# Patient Record
Sex: Female | Born: 1940 | Race: White | Hispanic: No | Smoking: Never smoker
Health system: Southern US, Community
[De-identification: ages and names within clinical notes are randomized; demographics above are authoritative.]

## PROBLEM LIST (undated history)

## (undated) DIAGNOSIS — G629 Polyneuropathy, unspecified: Secondary | ICD-10-CM

## (undated) DIAGNOSIS — F32A Depression, unspecified: Secondary | ICD-10-CM

## (undated) DIAGNOSIS — I10 Essential (primary) hypertension: Secondary | ICD-10-CM

## (undated) DIAGNOSIS — F329 Major depressive disorder, single episode, unspecified: Secondary | ICD-10-CM

## (undated) DIAGNOSIS — M519 Unspecified thoracic, thoracolumbar and lumbosacral intervertebral disc disorder: Secondary | ICD-10-CM

## (undated) DIAGNOSIS — I639 Cerebral infarction, unspecified: Secondary | ICD-10-CM

## (undated) HISTORY — DX: Polyneuropathy, unspecified: G62.9

## (undated) HISTORY — PX: CHOLECYSTECTOMY: SHX55

## (undated) HISTORY — DX: Essential (primary) hypertension: I10

## (undated) HISTORY — PX: BILATERAL SALPINGOOPHORECTOMY: SHX1223

## (undated) HISTORY — DX: Major depressive disorder, single episode, unspecified: F32.9

## (undated) HISTORY — DX: Cerebral infarction, unspecified: I63.9

## (undated) HISTORY — DX: Depression, unspecified: F32.A

## (undated) HISTORY — DX: Unspecified thoracic, thoracolumbar and lumbosacral intervertebral disc disorder: M51.9

## (undated) HISTORY — PX: TOTAL ABDOMINAL HYSTERECTOMY: SHX209

---

## 1991-10-03 HISTORY — PX: OTHER SURGICAL HISTORY: SHX169

## 2006-10-02 HISTORY — PX: PARTIAL COLECTOMY: SHX5273

## 2009-10-08 ENCOUNTER — Ambulatory Visit: Payer: Self-pay | Admitting: Family Medicine

## 2009-10-08 DIAGNOSIS — M545 Low back pain, unspecified: Secondary | ICD-10-CM | POA: Insufficient documentation

## 2009-10-08 DIAGNOSIS — I10 Essential (primary) hypertension: Secondary | ICD-10-CM | POA: Insufficient documentation

## 2009-10-08 DIAGNOSIS — F411 Generalized anxiety disorder: Secondary | ICD-10-CM | POA: Insufficient documentation

## 2009-10-08 DIAGNOSIS — F329 Major depressive disorder, single episode, unspecified: Secondary | ICD-10-CM | POA: Insufficient documentation

## 2009-10-08 DIAGNOSIS — F3289 Other specified depressive episodes: Secondary | ICD-10-CM | POA: Insufficient documentation

## 2009-11-17 ENCOUNTER — Telehealth: Payer: Self-pay | Admitting: Family Medicine

## 2009-12-15 ENCOUNTER — Telehealth: Payer: Self-pay | Admitting: Family Medicine

## 2009-12-31 ENCOUNTER — Ambulatory Visit: Payer: Self-pay | Admitting: Family Medicine

## 2010-02-08 ENCOUNTER — Telehealth: Payer: Self-pay | Admitting: Family Medicine

## 2010-04-05 ENCOUNTER — Ambulatory Visit: Payer: Self-pay | Admitting: Family Medicine

## 2010-04-05 DIAGNOSIS — K121 Other forms of stomatitis: Secondary | ICD-10-CM | POA: Insufficient documentation

## 2010-04-05 DIAGNOSIS — K1230 Oral mucositis (ulcerative), unspecified: Secondary | ICD-10-CM

## 2010-04-05 DIAGNOSIS — J029 Acute pharyngitis, unspecified: Secondary | ICD-10-CM

## 2010-04-05 DIAGNOSIS — R05 Cough: Secondary | ICD-10-CM

## 2010-05-02 ENCOUNTER — Ambulatory Visit: Payer: Self-pay | Admitting: Family Medicine

## 2010-05-02 DIAGNOSIS — G589 Mononeuropathy, unspecified: Secondary | ICD-10-CM | POA: Insufficient documentation

## 2010-05-04 LAB — CONVERTED CEMR LAB
ALT: 62 U/L — ABNORMAL HIGH (ref 0–35)
AST: 43 U/L — ABNORMAL HIGH (ref 0–37)
Albumin: 3.5 g/dL (ref 3.5–5.2)
Alkaline Phosphatase: 84 U/L (ref 39–117)
BUN: 13 mg/dL (ref 6–23)
Basophils Absolute: 0 K/uL (ref 0.0–0.1)
Basophils Relative: 0.5 % (ref 0.0–3.0)
Bilirubin, Direct: 0.2 mg/dL (ref 0.0–0.3)
CO2: 31 meq/L (ref 19–32)
Calcium: 9.6 mg/dL (ref 8.4–10.5)
Chloride: 106 meq/L (ref 96–112)
Creatinine, Ser: 0.8 mg/dL (ref 0.4–1.2)
Eosinophils Absolute: 0.2 K/uL (ref 0.0–0.7)
Eosinophils Relative: 2.7 % (ref 0.0–5.0)
GFR calc non Af Amer: 77.47 mL/min (ref >60)
Glucose, Bld: 122 mg/dL — ABNORMAL HIGH (ref 70–99)
HCT: 37.9 % (ref 36.0–46.0)
Hemoglobin: 13.3 g/dL (ref 12.0–15.0)
Hgb A1c MFr Bld: 5.5 % (ref 4.6–6.5)
Lymphocytes Relative: 40.5 % (ref 12.0–46.0)
Lymphs Abs: 3.2 K/uL (ref 0.7–4.0)
MCHC: 35 g/dL (ref 30.0–36.0)
MCV: 92.6 fL (ref 78.0–100.0)
Monocytes Absolute: 0.4 K/uL (ref 0.1–1.0)
Monocytes Relative: 5.7 % (ref 3.0–12.0)
Neutro Abs: 4 K/uL (ref 1.4–7.7)
Neutrophils Relative %: 50.6 % (ref 43.0–77.0)
Platelets: 146 K/uL — ABNORMAL LOW (ref 150.0–400.0)
Potassium: 4 meq/L (ref 3.5–5.1)
RBC: 4.09 M/uL (ref 3.87–5.11)
RDW: 14.5 % (ref 11.5–14.6)
Sodium: 140 meq/L (ref 135–145)
TSH: 3.27 u[IU]/mL (ref 0.35–5.50)
Total Bilirubin: 0.7 mg/dL (ref 0.3–1.2)
Total Protein: 5.9 g/dL — ABNORMAL LOW (ref 6.0–8.3)
Vitamin B-12: 494 pg/mL (ref 211–911)
WBC: 7.9 10*3/microliter (ref 4.5–10.5)

## 2010-05-05 ENCOUNTER — Ambulatory Visit: Payer: Self-pay | Admitting: Family Medicine

## 2010-05-05 ENCOUNTER — Telehealth: Payer: Self-pay | Admitting: Family Medicine

## 2010-05-05 DIAGNOSIS — K119 Disease of salivary gland, unspecified: Secondary | ICD-10-CM | POA: Insufficient documentation

## 2010-05-13 ENCOUNTER — Ambulatory Visit: Payer: Self-pay | Admitting: Family Medicine

## 2010-05-13 DIAGNOSIS — Z8679 Personal history of other diseases of the circulatory system: Secondary | ICD-10-CM | POA: Insufficient documentation

## 2010-05-25 ENCOUNTER — Telehealth: Payer: Self-pay | Admitting: Family Medicine

## 2010-05-30 ENCOUNTER — Ambulatory Visit: Payer: Self-pay | Admitting: Family Medicine

## 2010-05-30 DIAGNOSIS — J019 Acute sinusitis, unspecified: Secondary | ICD-10-CM

## 2010-06-07 ENCOUNTER — Ambulatory Visit: Payer: Self-pay | Admitting: Family Medicine

## 2010-06-07 DIAGNOSIS — S93409A Sprain of unspecified ligament of unspecified ankle, initial encounter: Secondary | ICD-10-CM | POA: Insufficient documentation

## 2010-07-01 ENCOUNTER — Telehealth: Payer: Self-pay | Admitting: Family Medicine

## 2010-07-06 ENCOUNTER — Ambulatory Visit: Payer: Self-pay | Admitting: Family Medicine

## 2010-07-06 DIAGNOSIS — J069 Acute upper respiratory infection, unspecified: Secondary | ICD-10-CM | POA: Insufficient documentation

## 2010-07-22 ENCOUNTER — Telehealth: Payer: Self-pay | Admitting: Family Medicine

## 2010-08-15 ENCOUNTER — Telehealth: Payer: Self-pay | Admitting: Family Medicine

## 2010-08-30 ENCOUNTER — Telehealth: Payer: Self-pay | Admitting: Family Medicine

## 2010-09-01 ENCOUNTER — Telehealth: Payer: Self-pay | Admitting: Family Medicine

## 2010-09-01 ENCOUNTER — Inpatient Hospital Stay (HOSPITAL_COMMUNITY)
Admission: EM | Admit: 2010-09-01 | Discharge: 2010-09-08 | Payer: Self-pay | Source: Home / Self Care | Attending: Internal Medicine | Admitting: Internal Medicine

## 2010-09-12 ENCOUNTER — Emergency Department (HOSPITAL_COMMUNITY)
Admission: EM | Admit: 2010-09-12 | Discharge: 2010-09-12 | Payer: Self-pay | Source: Home / Self Care | Admitting: Emergency Medicine

## 2010-09-14 ENCOUNTER — Telehealth: Payer: Self-pay | Admitting: Family Medicine

## 2010-09-15 ENCOUNTER — Telehealth: Payer: Self-pay | Admitting: Family Medicine

## 2010-09-27 ENCOUNTER — Telehealth: Payer: Self-pay | Admitting: Family Medicine

## 2010-10-06 ENCOUNTER — Telehealth: Payer: Self-pay | Admitting: Family Medicine

## 2010-10-11 ENCOUNTER — Ambulatory Visit: Admit: 2010-10-11 | Payer: Self-pay | Admitting: Family Medicine

## 2010-10-17 ENCOUNTER — Telehealth: Payer: Self-pay | Admitting: Family Medicine

## 2010-10-18 ENCOUNTER — Encounter: Payer: Self-pay | Admitting: Family Medicine

## 2010-10-25 ENCOUNTER — Telehealth: Payer: Self-pay | Admitting: Family Medicine

## 2010-10-25 ENCOUNTER — Encounter: Payer: Self-pay | Admitting: Family Medicine

## 2010-10-28 ENCOUNTER — Encounter: Payer: Self-pay | Admitting: Family Medicine

## 2010-10-31 ENCOUNTER — Encounter: Payer: Self-pay | Admitting: Family Medicine

## 2010-11-01 NOTE — Assessment & Plan Note (Signed)
Summary: F/U ON MEDS, FEVER / CHILLS // RS   Vital Signs:  Patient profile:   70 year old female Temp:     98.1 degrees F oral BP sitting:   92 / 68  (left arm) Cuff size:   regular  Vitals Entered By: Raechel Ache, RN (May 30, 2010 2:33 PM) CC: C/o sore throat, sores in mouth, toes tingling and hurt- quit Neurontin.   History of Present Illness: here with her daughter to follow up neuropathy pains in her feet and for a possibel sinus infection. We tried neurontin at 300 mg two times a day and also at 100 mg two times a day , but she cannot tolerate this due to sedation effects. Also she has had 2 weeks of sinus pressure, PND, ST, and a dry cough. No fever. She took some Amoxicillin a few weeks ago that did not do much for her.   Allergies: 1)  ! Sulfa  Past History:  Past Medical History: Reviewed history from 05/13/2010 and no changes required. 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 3 level lumbar disc fusion surgery in 1993 Cholecystectomy TAH and BSO partial colectomy in 2008 (removed 4 feet of bowel) for what sounds like mesenteric ischemia  Review of Systems  The patient denies anorexia, fever, weight loss, weight gain, vision loss, decreased hearing, hoarseness, chest pain, syncope, dyspnea on exertion, peripheral edema, hemoptysis, abdominal pain, melena, hematochezia, severe indigestion/heartburn, hematuria, incontinence, genital sores, muscle weakness, suspicious skin lesions, transient blindness, difficulty walking, depression, unusual weight change, abnormal bleeding, enlarged lymph nodes, angioedema, breast masses, and testicular masses.    Physical Exam  General:  Well-developed,well-nourished,in no acute distress;  alert,appropriate and cooperative throughout examination Head:  Normocephalic and atraumatic without obvious abnormalities. No apparent alopecia or balding. Eyes:  No corneal or conjunctival inflammation noted. EOMI. Perrla. Funduscopic exam benign, without hemorrhages, exudates or papilledema. Vision grossly normal. Ears:  External ear exam shows no significant lesions or deformities.  Otoscopic examination reveals clear canals, tympanic membranes are intact bilaterally without bulging, retraction, inflammation or discharge. Hearing is grossly normal bilaterally. Nose:  External nasal examination shows no deformity or inflammation. Nasal mucosa are pink and moist without lesions or exudates. Mouth:  Oral mucosa and oropharynx without lesions or exudates.  Teeth in good repair. Neck:  No deformities, masses, or tenderness noted. Lungs:  Normal respiratory effort, chest expands symmetrically. Lungs are clear to auscultation, no crackles or wheezes.   Impression & Recommendations:  Problem # 1:  ACUTE SINUSITIS, UNSPECIFIED (ICD-461.9)  The following medications were removed from the medication list:    Amoxicillin 500 Mg Caps (Amoxicillin) .Marland Kitchen... 1 stat then 1  morn midatternoon Her updated medication list for this problem includes:    Hydromet 5-1.5 Mg/31ml Syrp (Hydrocodone-homatropine) .Marland Kitchen... 2 tsp q4h as needed for cough    Zithromax Z-pak 250 Mg Tabs (Azithromycin) .Marland Kitchen... As directed  Problem # 2:  NEUROPATHY (ICD-355.9)  Complete Medication List: 1)  Clorazepate Dipotassium 7.5 Mg Tabs (Clorazepate dipotassium) .... 4 times daily 2)  Clonazepam 0.5 Mg Tabs (Clonazepam) .Marland Kitchen.. 1 four  times a day 3)  Oxycodone-acetaminophen 10-650 Mg Tabs (Oxycodone-acetaminophen) .... 4 times daily 4)  Diovan Hct 80-12.5 Mg Tabs (Valsartan-hydrochlorothiazide) .... Once daily 5)  Zanaflex 4 Mg Tabs (Tizanidine hcl) .Marland Kitchen.. 1 q  6 hours 6)  [facility]  .Marland Kitchen.. 1 tsp in mouth qid, rinse gargle and  swallow 7)  Hydromet 5-1.5 Mg/80ml Syrp (Hydrocodone-homatropine) .... 2 tsp q4h as needed for cough 8)  Dilaudid 4 Mg Tabs (Hydromorphone hcl) .Marland Kitchen.. 1 q 6 hours as needed for breakthrough pain 9)  Prozac 20 Mg Caps (Fluoxetine hcl) .... Dr patel 10)  Clobetasol Propionate 0.05 % Crea (Clobetasol propionate) .... Apply two times a day as needed 11)  Amitriptyline Hcl 25 Mg Tabs (Amitriptyline hcl) .... At bedtime 12)  Zithromax Z-pak 250 Mg Tabs (Azithromycin) .... As directed  Patient Instructions: 1)  Try a Zpack. Also try low dose Amitriptyline.  2)  Please schedule a follow-up appointment in 1 month.  Prescriptions: ZITHROMAX Z-PAK 250 MG TABS (AZITHROMYCIN) as directed  #1 x 0   Entered and Authorized by:   Nelwyn Salisbury MD   Signed by:   Nelwyn Salisbury MD on 05/30/2010   Method used:   [facility] to        [phone] ([facility]       [address]       [place], [place]  [address]       Ph: [phone] or [phone]       Fax: [phone]   RxID:   [id number] AMITRIPTYLINE HCL 25 MG TABS (AMITRIPTYLINE HCL) at bedtime  #30 x 5   Entered and Authorized by:   Nelwyn Salisbury MD   Signed by:   Nelwyn Salisbury MD on 05/30/2010   Method used:   [facility] to        [facility] [phone] (retail)       [address]       [place], [place]  [address]       Ph: [phone] or [phone]       Fax: [phone]   RxID:   [id number]

## 2010-11-01 NOTE — Progress Notes (Signed)
Summary: Pt req script for generic Percocet  Phone Note Call from Patient Call back at [phone] Karin Lieu Cell   Caller: daughter- Zella Ball Summary of Call: Pt is needing a new script for Percocet.  Please call when ready.  Initial call taken by: Lucy Antigua,  December 15, 2009 10:55 AM  Follow-up for Phone Call        done Follow-up by: Nelwyn Salisbury MD,  December 15, 2009 1:42 PM  Additional Follow-up for Phone Call Additional follow up Details #1::        Phone Call Completed Additional Follow-up by: Raechel Ache, RN,  December 15, 2009 1:51 PM    New/Updated Medications: OXYCODONE-ACETAMINOPHEN 10-650 MG TABS (OXYCODONE-ACETAMINOPHEN) 4 times daily Prescriptions: OXYCODONE-ACETAMINOPHEN 10-650 MG TABS (OXYCODONE-ACETAMINOPHEN) 4 times daily  #120 x 0   Entered and Authorized by:   Nelwyn Salisbury MD   Signed by:   Nelwyn Salisbury MD on 12/15/2009   Method used:   Print then Give to Patient   RxID:   [id number]

## 2010-11-01 NOTE — Assessment & Plan Note (Signed)
Summary: fu on meds/njr   Vital Signs:  Patient profile:   70 year old female Temp:     98.4 degrees F oral Pulse rate:   87 / minute Pulse rhythm:   regular BP sitting:   120 / 84  Vitals Entered By: Lynann Beaver CMA (December 31, 2009 11:20 AM) CC: requesting to try Zanaflex Is Patient Diabetic? No Pain Assessment Patient in pain? no        History of Present Illness: Here with her daughter, Melina Schools, to follow up chronic low back pain and HTN. She had recently tried some Zanaflex that she borrowed from a friend, if this was very helpful for muscle spasms. She would like to have some for herself. her BP at home is stable.  Current Medications (verified): 1)  Clorazepate Dipotassium 7.5 Mg Tabs (Clorazepate Dipotassium) .... 4 Times Daily 2)  Clonazepam 0.5 Mg Tabs (Clonazepam) .... Three Times A Day 3)  Oxycodone-Acetaminophen 10-650 Mg Tabs (Oxycodone-Acetaminophen) .... 4 Times Daily 4)  Diovan Hct 80-12.5 Mg Tabs (Valsartan-Hydrochlorothiazide) .... Once Daily  Allergies (verified): No Known Drug Allergies  Past History:  Past Medical History: Reviewed history from 10/08/2009 and no changes required. 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 3 level lumbar disc fusion surgery in 1993 Cholecystectomy TAH and BSO partial colectomy in 2008 (removed 4 feet of bowel) for what sounds like mesenteric ischemia  Review of Systems  The patient denies anorexia, fever, weight loss, weight gain, vision loss, decreased hearing, hoarseness, chest pain, syncope, dyspnea on exertion, peripheral edema, prolonged cough, headaches, hemoptysis, abdominal pain, melena, hematochezia, severe indigestion/heartburn, hematuria, incontinence, genital sores, muscle weakness, suspicious skin lesions, transient blindness, difficulty walking, depression, unusual weight change, abnormal  bleeding, enlarged lymph nodes, angioedema, breast masses, and testicular masses.    Physical Exam  General:  Well-developed,well-nourished,in no acute distress; alert,appropriate and cooperative throughout examination Lungs:  Normal respiratory effort, chest expands symmetrically. Lungs are clear to auscultation, no crackles or wheezes. Heart:  Normal rate and regular rhythm. S1 and S2 normal without gallop, murmur, click, rub or other extra sounds. Neurologic:  alert & oriented X3.     Impression & Recommendations:  Problem # 1:  LOW BACK PAIN, CHRONIC (ICD-724.2)  Her updated medication list for this problem includes:    Oxycodone-acetaminophen 10-650 Mg Tabs (Oxycodone-acetaminophen) .Marland KitchenMarland KitchenMarland KitchenMarland Kitchen 4 times daily    Zanaflex 4 Mg Tabs (Tizanidine hcl) .Marland Kitchen... 1 q 6 hours  Problem # 2:  HYPERTENSION (ICD-401.9)  Her updated medication list for this problem includes:    Diovan Hct 80-12.5 Mg Tabs (Valsartan-hydrochlorothiazide) ..... Once daily  Complete Medication List: 1)  Clorazepate Dipotassium 7.5 Mg Tabs (Clorazepate dipotassium) .... 4 times daily 2)  Clonazepam 0.5 Mg Tabs (Clonazepam) .... Three times a day 3)  Oxycodone-acetaminophen 10-650 Mg Tabs (Oxycodone-acetaminophen) .... 4 times daily 4)  Diovan Hct 80-12.5 Mg Tabs (Valsartan-hydrochlorothiazide) .... Once daily 5)  Zanaflex 4 Mg Tabs (Tizanidine hcl) .Marland Kitchen.. 1 q 6 hours  Patient Instructions: 1)  Add Zanaflex 4 times a day. 2)  Please schedule a follow-up appointment in 3 months .  Prescriptions: OXYCODONE-ACETAMINOPHEN 10-650 MG TABS (OXYCODONE-ACETAMINOPHEN) 4 times daily  #120 x 0   Entered and Authorized by:   Nelwyn Salisbury MD   Signed by:   Nelwyn Salisbury MD on 12/31/2009   Method used:   Print then Give to Patient   RxID:  [id number] ZANAFLEX 4 MG TABS (TIZANIDINE HCL) 1 q 6 hours  #120 x 5   Entered and Authorized by:   Nelwyn Salisbury MD   Signed by:   Nelwyn Salisbury MD on 12/31/2009   Method used:   Print  then Give to Patient   RxID:   [id number]

## 2010-11-01 NOTE — Progress Notes (Signed)
  Phone Note Call from Patient   Caller: Patient Call For: Nelwyn Salisbury MD Summary of Call:  Daughter got home from work and found Mom extremely lethargic, difficulty with speech, extremity weaknes and gurgling in chest.  Advised to call EMS ASAP. Initial call taken by: Lynann Beaver CMA AAMA,  September 01, 2010 9:51 AM  Follow-up for Phone Call        agreed  Follow-up by: Nelwyn Salisbury MD,  September 01, 2010 12:31 PM

## 2010-11-01 NOTE — Progress Notes (Signed)
Summary: new rx  Phone Note Call from Patient Call back at [phone]   Caller: Daughter-robin Call For: Nelwyn Salisbury MD Summary of Call: pt needs new rx for oxycodone-acetaminophen 10-650mg . please call robin at [phone] Initial call taken by: Heron Sabins,  November 17, 2009 4:16 PM  Follow-up for Phone Call        done Follow-up by: Nelwyn Salisbury MD,  November 17, 2009 4:59 PM  Additional Follow-up for Phone Call Additional follow up Details #1::        rx up front, ready for p/u, pt aware Additional Follow-up by: Alfred Levins, CMA,  November 17, 2009 5:09 PM    New/Updated Medications: OXYCODONE-ACETAMINOPHEN 10-650 MG TABS (OXYCODONE-ACETAMINOPHEN) 4 times daily Prescriptions: OXYCODONE-ACETAMINOPHEN 10-650 MG TABS (OXYCODONE-ACETAMINOPHEN) 4 times daily  #120 x 0   Entered and Authorized by:   Nelwyn Salisbury MD   Signed by:   Nelwyn Salisbury MD on 11/17/2009   Method used:   Print then Give to Patient   RxID:   [id number]

## 2010-11-01 NOTE — Progress Notes (Signed)
Summary: refill  Phone Note Refill Request Call back at [phone] Message from:  daughter---live call  Refills Requested: Medication #1:  OXYCODONE-ACETAMINOPHEN 10-650 MG TABS 4 times daily daughter , Zella Ball, will pick up rx.  Initial call taken by: Warnell Forester,  August 30, 2010 4:00 PM  Follow-up for Phone Call        done Follow-up by: Nelwyn Salisbury MD,  August 31, 2010 1:28 PM  Additional Follow-up for Phone Call Additional follow up Details #1::        daughter aware ready for pick up  Additional Follow-up by: Pura Spice, RN,  August 31, 2010 4:27 PM    New/Updated Medications: OXYCODONE-ACETAMINOPHEN 10-650 MG TABS (OXYCODONE-ACETAMINOPHEN) 4 times daily Prescriptions: OXYCODONE-ACETAMINOPHEN 10-650 MG TABS (OXYCODONE-ACETAMINOPHEN) 4 times daily  #120 x 0   Entered and Authorized by:   Nelwyn Salisbury MD   Signed by:   Nelwyn Salisbury MD on 08/31/2010   Method used:   Print then Give to Patient   RxID:   [id number]

## 2010-11-01 NOTE — Assessment & Plan Note (Signed)
Summary: cough/congestion/njr/pts daughter rsc/cjr   Vital Signs:  Patient profile:   70 year old female O2 Sat:      97 % Temp:     98.4 degrees F Pulse rate:   88 / minute BP sitting:   140 / 80  (left arm) Cuff size:   regular  Vitals Entered By: Pura Spice, RN (July 06, 2010 2:22 PM) CC: ck rt foot c/o spitting up green sputum    Contraindications/Deferment of Procedures/Staging:    Test/Procedure: Weight Refused    Reason for deferment: patient declined-cannot calculate BMI   History of Present Illness: here with her daughter for several reasons. First she has seemed more sedated over the past few weeks and is falling more at home. Four days ago she fell and twisted her right ankle. It has been swollen and painful ever since. Using ice and wearing a splint. She thinks the Amitriptyline we added recently is helping the neuropathy, but it seems to add to her overall sedation levels. Third, she has had a dry cough for severla days. No fever.   Allergies: 1)  ! Sulfa  Past History:  Past Medical History: Reviewed history from 05/13/2010 and no changes required. 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 3 level lumbar disc fusion surgery in 1993 Cholecystectomy TAH and BSO partial colectomy in 2008 (removed 4 feet of bowel) for what sounds like mesenteric ischemia  Review of Systems  The patient denies anorexia, fever, weight loss, weight gain, vision loss, decreased hearing, hoarseness, chest pain, syncope, dyspnea on exertion, peripheral edema, headaches, hemoptysis, abdominal pain, melena, hematochezia, severe indigestion/heartburn, hematuria, incontinence, genital sores, muscle weakness, suspicious skin lesions, transient  blindness, difficulty walking, depression, unusual weight change, abnormal bleeding, enlarged lymph nodes, angioedema, breast masses, and testicular masses.    Physical Exam  General:  in her wheelchair, alert Head:  Normocephalic and atraumatic without obvious abnormalities. No apparent alopecia or balding. Eyes:  No corneal or conjunctival inflammation noted. EOMI. Perrla. Funduscopic exam benign, without hemorrhages, exudates or papilledema. Vision grossly normal. Ears:  External ear exam shows no significant lesions or deformities.  Otoscopic examination reveals clear canals, tympanic membranes are intact bilaterally without bulging, retraction, inflammation or discharge. Hearing is grossly normal bilaterally. Nose:  External nasal examination shows no deformity or inflammation. Nasal mucosa are pink and moist without lesions or exudates. Mouth:  Oral mucosa and oropharynx without lesions or exudates.  Teeth in good repair. Neck:  No deformities, masses, or tenderness noted. Lungs:  Normal respiratory effort, chest expands symmetrically. Lungs are clear to auscultation, no crackles or wheezes. Extremities:  the right lateral malleolus is swollen and tender, no ecchymosis    Impression & Recommendations:  Problem # 1:  VIRAL URI (ICD-465.9)  Her updated medication list for this problem includes:    Hydromet 5-1.5 Mg/71ml Syrp (Hydrocodone-homatropine) .Marland Kitchen... 1 tsp q4h as needed for cough  Problem # 2:  ANKLE SPRAIN (ICD-845.00)  Her updated medication list for this problem includes:    Oxycodone-acetaminophen 10-650 Mg Tabs (Oxycodone-acetaminophen) .Marland KitchenMarland KitchenMarland KitchenMarland Kitchen 4 times daily    Dilaudid 4 Mg Tabs (Hydromorphone hcl) .Marland Kitchen... 1 q 6 hours as needed for breakthrough pain  Orders: T-Ankle Comp Right (73610TC)  Problem # 3:  CEREBROVASCULAR ACCIDENT, HX OF (ICD-V12.50)  Problem # 4:  NEUROPATHY (ICD-355.9)  Complete Medication List: 1)  Clorazepate Dipotassium 7.5 Mg Tabs (Clorazepate  dipotassium) .... 1/2 tab 4 times daily 2)  Clonazepam 0.5 Mg Tabs (Clonazepam) .Marland Kitchen.. 1 four  times a day 3)  Oxycodone-acetaminophen 10-650 Mg Tabs (Oxycodone-acetaminophen) .... 4 times daily 4)  Zanaflex 4 Mg Tabs (Tizanidine hcl) .... 1/2 tab  q 6 hours 5)  Hydromet 5-1.5 Mg/6ml Syrp (Hydrocodone-homatropine) .Marland Kitchen.. 1 tsp q4h as needed for cough 6)  Dilaudid 4 Mg Tabs (Hydromorphone hcl) .Marland Kitchen.. 1 q 6 hours as needed for breakthrough pain 7)  Prozac 20 Mg Caps (Fluoxetine hcl) .... Dr patel 8)  Clobetasol Propionate 0.05 % Crea (Clobetasol propionate) .... Apply two times a day as needed 9)  Amitriptyline Hcl 25 Mg Tabs (Amitriptyline hcl) .... At bedtime  Patient Instructions: 1)  A lot of her sedation is coming from her medications, so we will slowly taper down on some of them. Decrease Zanaflex and Chlorazepate by one half. Stay on Amitriptyline. Get Xrays of the right ankle.

## 2010-11-01 NOTE — Progress Notes (Signed)
Summary: NEED FOR CLARIFICATION OF Rx  Phone Note From Pharmacy   [id number] Summary of Call: Cindee Lame from [facility] called for clarification on a RX..... Advised that he can be reached at   [phone].  Initial call taken by: Debbra Riding,  May 05, 2010 3:51 PM  Follow-up for Phone Call        called by dr Scotty Court.  Follow-up by: Pura Spice, RN,  May 05, 2010 4:02 PM

## 2010-11-01 NOTE — Assessment & Plan Note (Signed)
Summary: ?sprained ankle/bruising/swelling/refills/cjr   Vital Signs:  Patient profile:   70 year old female O2 Sat:      94 % Temp:     98.3 degrees F Pulse rate:   90 / minute BP sitting:   120 / 86  (left arm)  Vitals Entered By: Pura Spice, RN (June 07, 2010 11:11 AM) CC: ?sprain left ankle onset sunday. refill clorazepate, oxycodone, dilaudid   Contraindications/Deferment of Procedures/Staging:    Test/Procedure: Weight Refused    Reason for deferment: patient declined-cannot calculate BMI   History of Present Illness: Here for an injury to the left ankle which occurred 2 days ago at home. While trying to stand up with her walker, she fell and her left foot twisted up beneath her. The lateral ankle immediately swelled, and she has had a lot of pain ever since. They have applied ice and an ACE wrap to it.   Allergies: 1)  ! Sulfa  Past History:  Past Medical History: Reviewed history from 05/13/2010 and no changes required. 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 3 level lumbar disc fusion surgery in 1993 Cholecystectomy TAH and BSO partial colectomy in 2008 (removed 4 feet of bowel) for what sounds like mesenteric ischemia  Review of Systems  The patient denies anorexia, fever, weight loss, weight gain, vision loss, decreased hearing, hoarseness, chest pain, syncope, dyspnea on exertion, peripheral edema, prolonged cough, headaches, hemoptysis, abdominal pain, melena, hematochezia, severe indigestion/heartburn, hematuria, incontinence, genital sores, muscle weakness, suspicious skin lesions, transient blindness, difficulty walking, depression, unusual weight change, abnormal bleeding, enlarged lymph nodes, angioedema, breast  masses, and testicular masses.    Physical Exam  General:  in her wheelchair Extremities:  the left lateral foot and ankle are ecchymotic, swollen, and very tender. Her ROM  is reduced.   Impression & Recommendations:  Problem # 1:  ANKLE SPRAIN (ICD-845.00)  Her updated medication list for this problem includes:    Oxycodone-acetaminophen 10-650 Mg Tabs (Oxycodone-acetaminophen) .Marland KitchenMarland KitchenMarland KitchenMarland Kitchen 4 times daily    Dilaudid 4 Mg Tabs (Hydromorphone hcl) .Marland Kitchen... 1 q 6 hours as needed for breakthrough pain  Orders: Orthopedic Referral (Ortho)  Problem # 2:  NEUROPATHY (ICD-355.9)  Problem # 3:  LOW BACK PAIN, CHRONIC (ICD-724.2)  Her updated medication list for this problem includes:    Oxycodone-acetaminophen 10-650 Mg Tabs (Oxycodone-acetaminophen) .Marland KitchenMarland KitchenMarland KitchenMarland Kitchen 4 times daily    Zanaflex 4 Mg Tabs (Tizanidine hcl) .Marland Kitchen... 1 q 6 hours    Dilaudid 4 Mg Tabs (Hydromorphone hcl) .Marland Kitchen... 1 q 6 hours as needed for breakthrough pain  Problem # 4:  ANXIETY STATE, UNSPECIFIED (ICD-300.00)  Her updated medication list for this problem includes:    Clorazepate Dipotassium 7.5 Mg Tabs (Clorazepate dipotassium) .Marland KitchenMarland KitchenMarland KitchenMarland Kitchen 4 times daily    Clonazepam 0.5 Mg Tabs (Clonazepam) .Marland Kitchen... 1 four  times a day    Prozac 20 Mg Caps (Fluoxetine hcl) .Marland Kitchen... Dr patel    Amitriptyline Hcl 25 Mg Tabs (Amitriptyline hcl) .Marland Kitchen... At bedtime  Complete Medication List: 1)  Clorazepate Dipotassium 7.5 Mg Tabs (Clorazepate dipotassium) .... 4 times daily 2)  Clonazepam 0.5 Mg Tabs (Clonazepam) .Marland Kitchen.. 1 four  times a day 3)  Oxycodone-acetaminophen 10-650 Mg Tabs (Oxycodone-acetaminophen) .... 4 times daily 4)  Zanaflex 4 Mg Tabs (Tizanidine hcl) .Marland Kitchen.. 1 q 6 hours  5)  Hydromet 5-1.5 Mg/51ml Syrp (Hydrocodone-homatropine) .... 2 tsp q4h as needed for cough 6)  Dilaudid 4 Mg Tabs (Hydromorphone hcl) .Marland Kitchen.. 1 q 6 hours as needed for breakthrough pain 7)  Prozac 20 Mg Caps (Fluoxetine hcl) .... Dr patel 8)  Clobetasol Propionate 0.05 % Crea (Clobetasol  propionate) .... Apply two times a day as needed 9)  Amitriptyline Hcl 25 Mg Tabs (Amitriptyline hcl) .... At bedtime  Patient Instructions: 1)  the ankle sprain is severe, so we will refer her to Orthopedics today. refilled meds as above.  Prescriptions: DILAUDID 4 MG TABS (HYDROMORPHONE HCL) 1 q 6 hours as needed for breakthrough pain  #120 x 0   Entered and Authorized by:   Nelwyn Salisbury MD   Signed by:   Nelwyn Salisbury MD on 06/07/2010   Method used:   Print then Give to Patient   RxID:   [id number] OXYCODONE-ACETAMINOPHEN 10-650 MG TABS (OXYCODONE-ACETAMINOPHEN) 4 times daily  #120 x 0   Entered and Authorized by:   Nelwyn Salisbury MD   Signed by:   Nelwyn Salisbury MD on 06/07/2010   Method used:   Print then Give to Patient   RxID:   [id number] CLORAZEPATE DIPOTASSIUM 7.5 MG TABS (CLORAZEPATE DIPOTASSIUM) 4 times daily  #120 x 5   Entered and Authorized by:   Nelwyn Salisbury MD   Signed by:   Nelwyn Salisbury MD on 06/07/2010   Method used:   Print then Give to Patient   RxID:   [id number]

## 2010-11-01 NOTE — Miscellaneous (Signed)
Summary: [facility]  [facility]   Imported By: Everrett Coombe 05/13/2010 14:19:29  _____________________________________________________________________  External Attachment:    Type:   Image     Comment:   External Document

## 2010-11-01 NOTE — Progress Notes (Signed)
Summary: pharmacy question  Phone Note From Pharmacy Call back at [phone]   [id number] Summary of Call: pharmacy calling about her being on Clnoazepam qid and Clorazepate both??? Initial call taken by: Raechel Ache, RN,  May 25, 2010 8:25 AM  Follow-up for Phone Call        yes she has been on both for a long time, so I want to stay that way Follow-up by: Nelwyn Salisbury MD,  May 25, 2010 4:37 PM  Additional Follow-up for Phone Call Additional follow up Details #1::        Pharmacist called Additional Follow-up by: Raechel Ache, RN,  May 25, 2010 4:49 PM

## 2010-11-01 NOTE — Progress Notes (Signed)
Summary: refill  Phone Note Refill Request Call back at [phone] Message from:  daughter---live call  Refills Requested: Medication #1:  OXYCODONE-ACETAMINOPHEN 10-650 MG TABS 4 times daily call daughter when ready.  Initial call taken by: Warnell Forester,  Feb 08, 2010 9:48 AM  Follow-up for Phone Call        done Follow-up by: Nelwyn Salisbury MD,  Feb 08, 2010 10:51 AM    Prescriptions: OXYCODONE-ACETAMINOPHEN 10-650 MG TABS (OXYCODONE-ACETAMINOPHEN) 4 times daily  #120 x 0   Entered and Authorized by:   Nelwyn Salisbury MD   Signed by:   Nelwyn Salisbury MD on 02/08/2010   Method used:   Print then Give to Patient   RxID:   [id number]

## 2010-11-01 NOTE — Assessment & Plan Note (Signed)
Summary: sore throat/swollen glands/dm   Vital Signs:  Patient profile:   70 year old female O2 Sat:      98 % Temp:     99 degrees F Pulse rate:   91 / minute BP sitting:   160 / 108  (left arm)  Vitals Entered By: Pura Spice, RN (May 05, 2010 2:49 PM) CC: l side neck very swollen c/o sore throat saw Dr Clent Ridges Monday and was given Gabapentin  daughter Arvil Chaco took to much gabapentin from her med box. had irritable crying episode  yest.    Contraindications/Deferment of Procedures/Staging:    Test/Procedure: Weight Refused    Reason for deferment: patient declined-cannot calculate BMI   CC:  l side neck very swollen c/o sore throat saw Dr Clent Ridges Monday and was given Gabapentin  daughter Arvil Chaco took to much gabapentin from her med box. had irritable crying episode  yest. .  History of Present Illness: repeat BP 130//17 This 70 year old white widowed with multiple problem of low back pain anxiety state hypertension and pelvis last week treated by Dr. Clent Ridges will her for neuropathy for which he gave her gabapentin The patient is in today complaining of having a large fold gland the mass in the left lower jaw last evening which occurred after the evening meal but did go down somewhat within one to 2 hour there patient's daughter thought it was due to the fact she taken too much gabapentin Lump continued to be present today as well as tender but increased after eating but then get smaller but never gone aw3ayrelates her normal temperature is 90.6 but has increased to 99 Blood pressure elevated on arrival 160/108 within rechecked 130/80  Allergies: 1)  ! Sulfa  Past History:  Past Medical History: Last updated: 10/08/2009 Depression Hypertension lumbar disc disease with chronic low back pain  Past Surgical History: Last updated: 10/08/2009 3 level lumbar disc fusion surgery in 1993 Cholecystectomy TAH and BSO partial colectomy in 2008 (removed 4 feet of bowel) for what sounds like  mesenteric ischemia  Social History: Last updated: 10/08/2009 Retired, disabled Widow/Widower Never Smoked Alcohol use-yes Drug use-no Regular exercise-no  Risk Factors: Smoking Status: never (10/08/2009)  Review of Systems      See HPI  The patient denies anorexia, fever, weight loss, weight gain, vision loss, decreased hearing, hoarseness, chest pain, syncope, dyspnea on exertion, peripheral edema, prolonged cough, headaches, hemoptysis, abdominal pain, melena, hematochezia, severe indigestion/heartburn, hematuria, incontinence, genital sores, muscle weakness, suspicious skin lesions, transient blindness, difficulty walking, depression, unusual weight change, abnormal bleeding, enlarged lymph nodes, angioedema, breast masses, and testicular masses.    Physical Exam  General:  Well-developed,well-nourished,in no acute distress; alert,appropriate and cooperative throughout examinationconfinement to wheelchair Head:  Normocephalic and atraumatic without obvious abnormalities. No apparent alopecia or balding. Eyes:  No corneal or conjunctival inflammation noted. EOMI. Perrla. Funduscopic exam benign, without hemorrhages, exudates or papilledema. Vision grossly normal. Ears:  External ear exam shows no significant lesions or deformities.  Otoscopic examination reveals clear canals, tympanic membranes are intact bilaterally without bulging, retraction, inflammation or discharge. Hearing is grossly normal bilaterally. Nose:  External nasal examination shows no deformity or inflammation. Nasal mucosa are pink and moist without lesions or exudates. Mouth:  a 2 x 3 prior firm and tender mass felt to the left lower jaw was able to massage to about half the size no other nodes or masses were palpable Tracts and old mucosa negative Neck:  No deformities, masses, or tenderness  noted. Lungs:  Normal respiratory effort, chest expands symmetrically. Lungs are clear to auscultation, no crackles or  wheezes. Abdomen:  Bowel sounds positive,abdomen soft and non-tender without masses, organomegaly or hernias noted. Extremities:  No clubbing, cyanosis, edema, or deformity noted with normal full range of motion of all joints.     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 10 days  Orders: Prescription Created Electronically [phone])  Problem # 2:  NEUROPATHY (ICD-355.9) Assessment: Unchanged gabapentin 300 mg one b.i.d.  Problem # 3:  LOW BACK PAIN, CHRONIC (ICD-724.2) Assessment: Unchanged  Her updated medication list for this problem includes:    Oxycodone-acetaminophen 10-650 Mg Tabs (Oxycodone-acetaminophen) .Marland KitchenMarland KitchenMarland KitchenMarland Kitchen 4 times daily    Zanaflex 4 Mg Tabs (Tizanidine hcl) .Marland Kitchen... 1 q 6 hours    Dilaudid 4 Mg Tabs (Hydromorphone hcl) .Marland Kitchen... 1 q 6 hours as needed for breakthrough pain  Complete Medication List: 1)  Clorazepate Dipotassium 7.5 Mg Tabs (Clorazepate dipotassium) .... 4 times daily 2)  Clonazepam 0.5 Mg Tabs (Clonazepam) .... Three times a day 3)  Oxycodone-acetaminophen 10-650 Mg Tabs (Oxycodone-acetaminophen) .... 4 times daily 4)  Diovan Hct 80-12.5 Mg Tabs (Valsartan-hydrochlorothiazide) .... Once daily 5)  Zanaflex 4 Mg Tabs (Tizanidine hcl) .Marland Kitchen.. 1 q 6 hours 6)  [facility]  .Marland Kitchen.. 1 tsp in mouth qid, rinse gargle and swallow 7)  Hydromet 5-1.5 Mg/40ml Syrp (Hydrocodone-homatropine) .... 2 tsp q4h as needed for cough 8)  Dilaudid 4 Mg Tabs (Hydromorphone hcl) .Marland Kitchen.. 1 q 6 hours as needed for breakthrough pain 9)  Neurontin 300 Mg Caps (Gabapentin) .... Two times a day 10)  Amoxicillin 500 Mg Caps (Amoxicillin) .Marland Kitchen.. 1 stat then 1  morn midatternoon 11)  Prozac 20 Mg Caps (Fluoxetine hcl) .... Dr patel  Patient Instructions: 1)  I feel the problem with a salary gland as it is a low-grade type infection which  did respond to amoxicillin 2)  If you continue to have problem and it is not resolved then returned to see Dr. Clent Ridges 3)  Continue your regular medications for the neuropathy as well as her chronic low back pain and other medical problems 4)  we'll call in April prescription refills of Prozac that the physician in [place] prescribed for you for this one refill Prescriptions: AMOXICILLIN 500 MG CAPS (AMOXICILLIN) 1 stat then 1  morn midatternoon  #30 x 0   Entered and Authorized by:   Judithann Sheen MD   Signed by:   Judithann Sheen MD on 05/05/2010   Method used:   [facility]        [facility] [phone]* (retail)       [address]       [place], [place]  [address]       Ph: [phone] or [phone]       Fax: [phone]   RxID:   [id number]

## 2010-11-01 NOTE — Progress Notes (Signed)
Summary: rx dilaudid  Phone Note Refill Request Call back at [phone] Message from:  daughter---live call  Refills Requested: Medication #1:  DILAUDID 4 MG TABS 1 q 6 hours as needed for breakthrough pain   Brand Name Necessary? No call daughter when ready for pickup.  Initial call taken by: Warnell Forester,  August 15, 2010 1:35 PM  Follow-up for Phone Call        done Follow-up by: Nelwyn Salisbury MD,  August 15, 2010 3:31 PM  Additional Follow-up for Phone Call Additional follow up Details #1::        daughter informed . Additional Follow-up by: Pura Spice, RN,  August 15, 2010 4:10 PM    Prescriptions: DILAUDID 4 MG TABS (HYDROMORPHONE HCL) 1 q 6 hours as needed for breakthrough pain  #120 x 0   Entered and Authorized by:   Nelwyn Salisbury MD   Signed by:   Nelwyn Salisbury MD on 08/15/2010   Method used:   Print then Give to Patient   RxID:   [id number]

## 2010-11-01 NOTE — Assessment & Plan Note (Signed)
Summary: ST/NJR   Vital Signs:  Patient profile:   70 year old female O2 Sat:      97 % Temp:     99.1 degrees F Pulse rate:   90 / minute Pulse rhythm:   regular BP sitting:   120 / 70  (left arm) Cuff size:   regular  Vitals Entered By: Pura Spice, RN (April 05, 2010 3:08 PM) CC: sore throat cougyh x 1 wk ck left ear having chills    History of Present Illness: This 70 year old female complaining of sore throat and dry call for proximally one week as well as some_mucosa. Concerned about the left ear. States she did have a light shield and than 99.1 is afever for her continues to have low back pain however control was oxycodone Blood pressure control  Allergies (verified): No Known Drug Allergies  Past History:  Past Medical History: Last updated: 10/08/2009 Depression Hypertension lumbar disc disease with chronic low back pain  Past Surgical History: Last updated: 10/08/2009 3 level lumbar disc fusion surgery in 1993 Cholecystectomy TAH and BSO partial colectomy in 2008 (removed 4 feet of bowel) for what sounds like mesenteric ischemia  Social History: Last updated: 10/08/2009 Retired, disabled Widow/Widower Never Smoked Alcohol use-yes Drug use-no Regular exercise-no  Risk Factors: Smoking Status: never (10/08/2009)  Review of Systems      See HPI  The patient denies anorexia, fever, weight loss, weight gain, vision loss, decreased hearing, hoarseness, chest pain, syncope, dyspnea on exertion, peripheral edema, prolonged cough, headaches, hemoptysis, abdominal pain, melena, hematochezia, severe indigestion/heartburn, hematuria, incontinence, genital sores, muscle weakness, suspicious skin lesions, transient blindness, difficulty walking, depression, unusual weight change, abnormal bleeding, enlarged lymph nodes, angioedema, breast masses, and testicular masses.    Physical Exam  General:  Well-developed,well-nourished,in no acute distress;  alert,appropriate and cooperative throughout examination Head:  Normocephalic and atraumatic without obvious abnormalities. No apparent alopecia or balding. Eyes:  No corneal or conjunctival inflammation noted. EOMI. Perrla. Funduscopic exam benign, without hemorrhages, exudates or papilledema. Vision grossly normal. Ears:  External ear exam shows no significant lesions or deformities.  Otoscopic examination reveals clear canals, tympanic membranes are intact bilaterally without bulging, retraction, inflammation or discharge. Hearing is grossly normal bilaterally. Nose:  slight nasal congestion Mouth:  pharyngeal erythema.   oral mucosa erythematous Lungs:  Normal respiratory effort, chest expands symmetrically. Lungs are clear to auscultation, no crackles or wheezes. Heart:  Normal rate and regular rhythm. S1 and S2 normal without gallop, murmur, click, rub or other extra sounds.   Impression & Recommendations:  Problem # 1:  COUGH (ICD-786.2) Assessment New Hydromet for cough  Problem # 2:  STOMATITIS AND MUCOSITIS UNSPECIFIED (ICD-528.00) Assessment: New Magic mouthwash q.i.d.  Problem # 3:  ACUTE PHARYNGITIS (ICD-462) Assessment: New  Her updated medication list for this problem includes:    Cephalexin 500 Mg Caps (Cephalexin) .Marland Kitchen... 1 qid for 2 days then 1 two times a day until finished  Problem # 4:  LOW BACK PAIN, CHRONIC (ICD-724.2) Assessment: Unchanged  Her updated medication list for this problem includes:    Oxycodone-acetaminophen 10-650 Mg Tabs (Oxycodone-acetaminophen) .Marland KitchenMarland KitchenMarland KitchenMarland Kitchen 4 times daily    Zanaflex 4 Mg Tabs (Tizanidine hcl) .Marland Kitchen... 1 q 6 hours  Complete Medication List: 1)  Clorazepate Dipotassium 7.5 Mg Tabs (Clorazepate dipotassium) .... 4 times daily 2)  Clonazepam 0.5 Mg Tabs (Clonazepam) .... Three times a day 3)  Oxycodone-acetaminophen 10-650 Mg Tabs (Oxycodone-acetaminophen) .... 4 times daily 4)  Diovan Hct 80-12.5  Mg Tabs (Valsartan-hydrochlorothiazide)  .... Once daily 5)  Zanaflex 4 Mg Tabs (Tizanidine hcl) .Marland Kitchen.. 1 q 6 hours 6)  [facility]  .Marland Kitchen.. 1 tsp in mouth qid, rinse gargle and swallow 7)  Cephalexin 500 Mg Caps (Cephalexin) .Marland Kitchen.. 1 qid for 2 days then 1 two times a day until finished 8)  Hydromet 5-1.5 Mg/27ml Syrp (Hydrocodone-homatropine) .... 2 tsp q4h as needed for cough  Patient Instructions: 1)  acute friend diagnosed, stomatitis and cough 2)  Patent medications as prescribed 3)  Call or return if not relieved Prescriptions: CEPHALEXIN 500 MG CAPS (CEPHALEXIN) 1 QID FOR 2 DAYS THEN 1 two times a day UNTIL FINISHED  #30 x 0   Entered and Authorized by:   Judithann Sheen MD   Signed by:   Judithann Sheen MD on 04/05/2010   Method used:   Electronically to        [facility] [phone]* (retail)       [address]       [place], [place]  [address]       Ph: [phone] or [phone]       Fax: [phone]   RxID:   [id number] HYDROMET 5-1.5 MG/5ML SYRP (HYDROCODONE-HOMATROPINE) 2 TSP Q4H as needed FOR COUGH  #240 x 1   Entered and Authorized by:   Judithann Sheen MD   Signed by:   Judithann Sheen MD on 04/05/2010   Method used:   Print then Give to Patient   RxID:   [id number] CEPHALEXIN 500 MG CAPS (CEPHALEXIN) 1 QID FOR 2 DAYS THEN 1 two times a day UNTIL FINISHED  #30 x 0   Entered and Authorized by:   Judithann Sheen MD   Signed by:   Judithann Sheen MD on 04/05/2010   Method used:   Electronically to        [facility] [phone]* (retail)       [address]       [place], [place]  [address]       Ph: [phone] or [phone]       Fax: [phone]   RxID:   [id number] MAGICMOUTH [facility] HC 1 TSP IN MOUTH QID, RINSE GARGLE AND SWALLOW  #240CC x 1   Entered and Authorized by:   Judithann Sheen MD   Signed by:   Judithann Sheen MD on 04/05/2010   Method used:   Print then Give to Patient   RxID:   [id number]

## 2010-11-01 NOTE — Miscellaneous (Signed)
Summary: PROTECTED HEALTH INFORMATION  PROTECTED HEALTH INFORMATION   Imported By: Everrett Coombe 05/13/2010 14:20:03  _____________________________________________________________________  External Attachment:    Type:   Image     Comment:   External Document

## 2010-11-01 NOTE — Progress Notes (Signed)
Summary: antibiotic and cough med  Phone Note Call from Patient   Caller: Daughter Call For: Karen Salisbury MD Summary of Call: Daughter is calling to ask for an antibioitc and cough med for her mother that has the same URI that she and her son were treated for this week. [facility] [phone] Initial call taken by: Lynann Beaver CMA AAMA,  July 22, 2010 2:16 PM  Follow-up for Phone Call        call in a Zpack and another 240 ml bottle of Hydromet Follow-up by: Karen Salisbury MD,  July 22, 2010 3:02 PM  Additional Follow-up for Phone Call Additional follow up Details #1::        pt called  rx called in.  Additional Follow-up by: Pura Spice, RN,  July 22, 2010 3:56 PM    New/Updated Medications: AZITHROMYCIN 250 MG  TABS (AZITHROMYCIN) 2 by  mouth today and then 1 daily for 4 days Prescriptions: AZITHROMYCIN 250 MG  TABS (AZITHROMYCIN) 2 by  mouth today and then 1 daily for 4 days  #6 x 0   Entered by:   Pura Spice, RN   Authorized by:   Karen Salisbury MD   Signed by:   Pura Spice, RN on 07/22/2010   Method used:   Telephoned to ...       [id number] ([facility]ail)       [address]       [place], [place]  [address]       Ph: [phone] or [phone]       Fax: [phone]   RxID:   [id number] HYDROMET 5-1.5 MG/5ML SYRP (HYDROCODONE-HOMATROPINE) 1 TSP Q4H as needed FOR COUGH  #240 x 0   Entered by:   Pura Spice, RN   Authorized by:   Karen Salisbury MD   Signed by:   Pura Spice, RN on 07/22/2010   Method used:   Telephoned to ...       [facility] [address] (retail)       [address]       [place], [place]  [address]       Ph: [phone] or [phone]       Fax: [phone]   RxID:   [id number]

## 2010-11-01 NOTE — Progress Notes (Signed)
Summary: wants refill prozac   Phone Note Call from Patient   Caller: Daughter  Zella Ball)  [phone] Summary of Call: Pts daughter Zella Ball) called to advise that she was supposed to c/b and speak with Dr Scotty Court ref to dosage for pts med: Prozac.... She adv that pt takes  Prozac (Fluoxetine)  20 mg (q.i.d.  -  4 times a day)...... [facility] [address]   Initial call taken by: Debbra Riding,  May 05, 2010 4:16 PM  Follow-up for Phone Call        ok 'd per dr Scotty Court  ok x 1 but no refills  Follow-up by: Pura Spice, RN,  May 05, 2010 4:17 PM     Appended Document: wants refill prozac  [facility] but per Daron Offer pharmascist at [facility] pt has med on hold from Dr Allena Katz. So,no rx given at this time for this med. dr Scotty Court aware.

## 2010-11-01 NOTE — Assessment & Plan Note (Signed)
Summary: not doing well on Neurontin/dm   Vital Signs:  Patient profile:   70 year old female Temp:     98.8 degrees F oral BP sitting:   130 / 82  (left arm) Cuff size:   regular  Vitals Entered By: Raechel Ache, RN (May 13, 2010 2:27 PM) CC: Having problems taking Neurontin- speech slurred, off balance, dry mouth, tired. C/o tingling toes.   History of Present Illness: here with her daughter to follow up on a blocked salivary gland and on neuropathy. She is taking Amoxiciilin, and the salivary gland has gone down a lot. She has only mild tenderness now. She has had good results from Neurontin, but she stopped it a few days ago due to side effects, primarilty sedation and confusion.   Allergies: 1)  ! 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 3 level lumbar disc fusion surgery in 1993 Cholecystectomy TAH and BSO partial colectomy in 2008 (removed 4 feet of bowel) for what sounds like mesenteric ischemia  Review of Systems  The patient denies anorexia, fever, weight loss, weight gain, vision loss, decreased hearing, hoarseness, chest pain, syncope, dyspnea on exertion, peripheral edema, prolonged cough, headaches, hemoptysis, abdominal pain, melena, hematochezia, severe indigestion/heartburn, hematuria, incontinence, genital sores, muscle weakness, suspicious skin lesions, transient blindness, difficulty walking, depression, unusual weight change, abnormal bleeding, enlarged lymph nodes, angioedema, breast masses, and testicular masses.    Physical Exam  General:  alert, at her baseline Neck:  small tender lump under the left mandible Lungs:  Normal respiratory effort, chest expands  symmetrically. Lungs are clear to auscultation, no crackles or wheezes. Heart:  Normal rate and regular rhythm. S1 and S2 normal without gallop, murmur, click, rub or other extra sounds. Neurologic:  alert & oriented X3.     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 4 times daily 2)  Clonazepam 0.5 Mg Tabs (Clonazepam) .... Three times a day 3)  Oxycodone-acetaminophen 10-650 Mg Tabs (Oxycodone-acetaminophen) .... 4 times daily 4)  Diovan Hct 80-12.5 Mg Tabs (Valsartan-hydrochlorothiazide) .... Once daily 5)  Zanaflex 4 Mg Tabs (Tizanidine hcl) .Marland Kitchen.. 1 q 6 hours 6)  [facility]  .Marland Kitchen.. 1 tsp in mouth qid, rinse gargle and swallow 7)  Hydromet 5-1.5 Mg/35ml Syrp (Hydrocodone-homatropine) .... 2 tsp q4h as needed for cough 8)  Dilaudid 4 Mg Tabs (Hydromorphone hcl) .Marland Kitchen.. 1 q 6 hours as needed for breakthrough pain 9)  Amoxicillin 500 Mg Caps (Amoxicillin) .Marland Kitchen.. 1 stat then 1  morn midatternoon 10)  Prozac 20 Mg Caps (Fluoxetine hcl) .... Dr patel 11)  Neurontin 100 Mg Caps (Gabapentin) .... Two times a day 12)  Clobetasol Propionate 0.05 % Crea (Clobetasol propionate) .... Apply two times a day as needed  Patient Instructions: 1)  Finish out the antibiotics. try Neurontin again at a lower dose (100 mg two times a day ) .  2)  Please schedule a follow-up appointment as needed .  Prescriptions: CLOBETASOL PROPIONATE 0.05 % CREA (CLOBETASOL PROPIONATE) apply two times a day as needed  #60 x 5   Entered and Authorized by:  Nelwyn Salisbury MD   Signed by:   Nelwyn Salisbury MD on 05/13/2010   Method used:   Electronically to        [facility] [phone]* (retail)       [address]       [place], [place]  [address]       Ph: [phone] or [phone]       Fax: [phone]   RxID:    [id number] NEURONTIN 100 MG CAPS (GABAPENTIN) two times a day  #60 x 2   Entered and Authorized by:   Nelwyn Salisbury MD   Signed by:   Nelwyn Salisbury MD on 05/13/2010   Method used:   Electronically to        [facility] [phone]* (retail)       [address]       [place], [place]  [address]       Ph: [phone] or [phone]       Fax: [phone]   RxID:   [id number]

## 2010-11-01 NOTE — Assessment & Plan Note (Signed)
Summary: TO BE EST/OK PER DOC/NJR   Vital Signs:  Patient profile:   70 year old female Temp:     98.2 degrees F oral Pulse rate:   88 / minute BP sitting:   114 / 80  (left arm) Cuff size:   regular  Vitals Entered By: Alfred Levins, CMA (October 08, 2009 1:37 PM) CC: establish, renew meds, insomnia pt in a wheel chair, can't weigh or get height  History of Present Illness: 70 yr old female here with her daughter to establish with [place]. She has been living in [place], [place], and for awhile has been shuttling back and forth from [place] to here. She spends about 3 weeks out of 4 here with her daughter. She is disabled with severe chronic back pain, and is wheelchair bound most of the time. She can stand and transfer, but cannot walk more than a few steps. Her husband died last 11-Jun-2023, and now she lives by herself. She is still struggling with grief over losing him as well. The plan it seems is for her to move here to live with her daughter's family sometime later this year. They want to meet me so she can see me when she is here in [place]. She has been seeing Dr. Fransisca Kaufmann, an internist in [place] [phone]).  She also suffers from depression and some HTN. Most of her difficulties arose from an accident in 1993 when she fell into a hole in the ground and injured her back. She has multiple herniated lumbar discs, and she ended up getting a 3 level lumbar fusion in 1993. She has been to 6 different chronic pain clinics in [place] and [place], but nothing seemed to help much. Now she relies on chronic pain meds only.   Preventive Screening-Counseling & Management  Alcohol-Tobacco     Smoking Status: never  Caffeine-Diet-Exercise     Does Patient Exercise: no      Drug Use:  no.    Current Medications (verified): 1)  Clorazepate Dipotassium 7.5 Mg Tabs (Clorazepate Dipotassium) .... 4 Times Daily 2)  Clonazepam 0.5 Mg Tabs (Clonazepam) .... Three Times A Day 3)  Oxycodone-Acetaminophen 10-650 Mg  Tabs (Oxycodone-Acetaminophen) .... 4 Times Daily 4)  Diovan Hct 160-12.5 Mg  Tabs (Valsartan-Hydrochlorothiazide) .... 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 Family History of Arthritis Family History of Prostate CA 1st degree relative <50  Social History: Reviewed history and no changes required. Retired, disabled Widow/Widower Never Smoked Alcohol use-yes Drug use-no Regular exercise-no Smoking Status:  never Drug Use:  no Does Patient Exercise:  no  Review of Systems  The patient denies anorexia, fever, weight loss, weight gain, vision loss, decreased hearing, hoarseness, chest pain, syncope, dyspnea on exertion, peripheral edema, prolonged cough, headaches, hemoptysis, abdominal pain, melena, hematochezia, severe indigestion/heartburn, hematuria, incontinence, genital sores, muscle weakness, suspicious skin lesions, transient blindness, difficulty walking, depression, unusual weight change, abnormal bleeding, enlarged lymph nodes, angioedema, breast masses, and testicular masses.    Physical Exam  General:  in her wheelchair, otherwise in no distress Neck:  No deformities, masses, or tenderness noted. Lungs:  Normal respiratory effort, chest  expands symmetrically. Lungs are clear to auscultation, no crackles or wheezes. Heart:  Normal rate and regular rhythm. S1 and S2 normal without gallop, murmur, click, rub or other extra sounds. Psych:  Oriented X3, memory intact for recent and remote, normally interactive, good eye contact, and slightly anxious.     Impression &  Recommendations:  Problem # 1:  LOW BACK PAIN, CHRONIC (ICD-724.2)  Her updated medication list for this problem includes:    Oxycodone-acetaminophen 10-650 Mg Tabs (Oxycodone-acetaminophen) .Marland KitchenMarland KitchenMarland KitchenMarland Kitchen 4 times daily  Problem # 2:  ANXIETY STATE, UNSPECIFIED (ICD-300.00)  Her updated medication list for this problem includes:    Clorazepate Dipotassium 7.5 Mg Tabs (Clorazepate dipotassium) .Marland KitchenMarland KitchenMarland KitchenMarland Kitchen 4 times daily    Clonazepam 0.5 Mg Tabs (Clonazepam) .Marland Kitchen... Three times a day  Problem # 3:  DEPRESSION (ICD-311)  Her updated medication list for this problem includes:    Clorazepate Dipotassium 7.5 Mg Tabs (Clorazepate dipotassium) .Marland KitchenMarland KitchenMarland KitchenMarland Kitchen 4 times daily    Clonazepam 0.5 Mg Tabs (Clonazepam) .Marland Kitchen... Three times a day  Problem # 4:  HYPERTENSION (ICD-401.9)  The following medications were removed from the medication list:    Diovan Hct 160-12.5 Mg Tabs (Valsartan-hydrochlorothiazide) ..... One by mouth every morning Her updated medication list for this problem includes:    Diovan Hct 80-12.5 Mg Tabs (Valsartan-hydrochlorothiazide) ..... Once daily  Complete Medication List: 1)  Clorazepate Dipotassium 7.5 Mg Tabs (Clorazepate dipotassium) .... 4 times daily 2)  Clonazepam 0.5 Mg Tabs (Clonazepam) .... Three times a day 3)  Oxycodone-acetaminophen 10-650 Mg Tabs (Oxycodone-acetaminophen) .... 4 times daily 4)  Diovan Hct 80-12.5 Mg Tabs (Valsartan-hydrochlorothiazide) .... Once daily  Patient Instructions: 1)  I did refill her meds as above. we will arrange to have her records sent here.  Prescriptions: OXYCODONE-ACETAMINOPHEN 10-650 MG TABS (OXYCODONE-ACETAMINOPHEN) 4 times daily  #120 x 0   Entered and Authorized by:   Nelwyn Salisbury MD   Signed by:   Nelwyn Salisbury MD on 10/08/2009   Method used:   Print then Give to Patient   RxID:   [id number] DIOVAN HCT 80-12.5 MG TABS (VALSARTAN-HYDROCHLOROTHIAZIDE) once daily  #30 x 11   Entered and Authorized by:   Nelwyn Salisbury MD   Signed by:    Nelwyn Salisbury MD on 10/08/2009   Method used:   Print then Give to Patient   RxID:   [id number]

## 2010-11-01 NOTE — Progress Notes (Signed)
Summary: new rx  Phone Note Refill Request Call back at [phone] Message from:  Patient daugh robin  Refills Requested: Medication #1:  HYDROMET 5-1.5 MG/5ML SYRP 2 TSP Q4H as needed FOR COUGH  Medication #2:  OXYCODONE-ACETAMINOPHEN 10-650 MG TABS 4 times daily pt has ov sch for 07-05-2010  Initial call taken by: Heron Sabins,  July 01, 2010 3:39 PM  Follow-up for Phone Call        done Follow-up by: Nelwyn Salisbury MD,  July 01, 2010 4:25 PM  Additional Follow-up for Phone Call Additional follow up Details #1::        mess left on robin  daughter cell phone.  mess left on pt vm rx ready for pick up. Additional Follow-up by: Pura Spice, RN,  July 01, 2010 5:00 PM    New/Updated Medications: HYDROMET 5-1.5 MG/5ML SYRP (HYDROCODONE-HOMATROPINE) 1 TSP Q4H as needed FOR COUGH Prescriptions: HYDROMET 5-1.5 MG/5ML SYRP (HYDROCODONE-HOMATROPINE) 1 TSP Q4H as needed FOR COUGH  #240 x 0   Entered and Authorized by:   Nelwyn Salisbury MD   Signed by:   Nelwyn Salisbury MD on 07/01/2010   Method used:   Print then Give to Patient   RxID:   [id number] OXYCODONE-ACETAMINOPHEN 10-650 MG TABS (OXYCODONE-ACETAMINOPHEN) 4 times daily  #120 x 0   Entered and Authorized by:   Nelwyn Salisbury MD   Signed by:   Nelwyn Salisbury MD on 07/01/2010   Method used:   Print then Give to Patient   RxID:   [id number]

## 2010-11-01 NOTE — Assessment & Plan Note (Signed)
Summary: sweats--chills//ccm   Vital Signs:  Patient profile:   70 year old female Temp:     98.3 degrees F oral BP sitting:   110 / 80  (left arm) Cuff size:   regular CC: Sweats, chills, hot flushes at night, starts at Left ear then moves down her body x 2 weeks,break thru pain, not sleeping, Pins & needles in feet,worse at night,src   History of Present Illness: Here for chronic low back pain and for some new symptoms. First, she would like a rx for breahthrough pains that she gets several times a week. These are severe pains that do not respond well to oxycodone. Also for several weeks she has had tingling, numbness, and burning type pains in both feet and all toes. this does not affect the hands. She has had spells where she feels very warm, and some periods where she is cold.   Current Medications (verified): 1)  Clorazepate Dipotassium 7.5 Mg Tabs (Clorazepate Dipotassium) .... 4 Times Daily 2)  Clonazepam 0.5 Mg Tabs (Clonazepam) .... Three Times A Day 3)  Oxycodone-Acetaminophen 10-650 Mg Tabs (Oxycodone-Acetaminophen) .... 4 Times Daily 4)  Diovan Hct 80-12.5 Mg Tabs (Valsartan-Hydrochlorothiazide) .... Once Daily 5)  Zanaflex 4 Mg Tabs (Tizanidine Hcl) .Marland Kitchen.. 1 Q 6 Hours 6)  [facility] [facility] .Marland Kitchen.. 1 Tsp in Mouth Qid, Rinse Gargle and Swallow 7)  Hydromet 5-1.5 Mg/40ml Syrp (Hydrocodone-Homatropine) .... 2 Tsp Q4h As Needed For Cough  Allergies (verified): 1)  ! Sulfa  Past History:  Past Medical History: Reviewed history from 10/08/2009 and no changes required. 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 3 level lumbar disc fusion surgery in 1993 Cholecystectomy TAH and BSO partial colectomy in 2008 (removed 4 feet of bowel) for what sounds like mesenteric ischemia  Review of Systems  The patient denies anorexia, fever, weight loss, weight gain, vision loss,  decreased hearing, hoarseness, chest pain, syncope, dyspnea on exertion, peripheral edema, prolonged cough, headaches, hemoptysis, abdominal pain, melena, hematochezia, severe indigestion/heartburn, hematuria, incontinence, genital sores, muscle weakness, suspicious skin lesions, transient blindness, difficulty walking, depression, unusual weight change, abnormal bleeding, enlarged lymph nodes, angioedema, breast masses, and testicular masses.    Physical Exam  General:  alert, in a wheelchair Neck:  No deformities, masses, or tenderness noted. Lungs:  Normal respiratory effort, chest expands symmetrically. Lungs are clear to auscultation, no crackles or wheezes. Heart:  Normal rate and regular rhythm. S1 and S2 normal without gallop, murmur, click, rub or other extra sounds. Pulses:  distal pulses are full Extremities:  No clubbing, cyanosis, edema, or deformity noted with normal full range of motion of all joints.   Neurologic:  alert & oriented X3.     Impression & Recommendations:  Problem # 1:  NEUROPATHY (ICD-355.9)  Orders: Venipuncture (54098) TLB-BMP (Basic Metabolic Panel-BMET) (80048-METABOL) TLB-CBC Platelet - w/Differential (85025-CBCD) TLB-Hepatic/Liver Function Pnl (80076-HEPATIC) TLB-TSH (Thyroid Stimulating Hormone) (84443-TSH) TLB-A1C / Hgb A1C (Glycohemoglobin) (83036-A1C) TLB-B12, Serum-Total ONLY (11914-N82)  Problem # 2:  LOW BACK PAIN, CHRONIC (ICD-724.2)  Her updated medication list for this problem includes:    Oxycodone-acetaminophen 10-650 Mg Tabs (Oxycodone-acetaminophen) .Marland KitchenMarland KitchenMarland KitchenMarland Kitchen 4 times daily    Zanaflex 4 Mg Tabs (Tizanidine hcl) .Marland Kitchen... 1 q 6 hours    Dilaudid 4 Mg Tabs (Hydromorphone hcl) .Marland Kitchen... 1 q 6 hours as needed for breakthrough pain  Complete Medication List: 1)  Clorazepate Dipotassium 7.5 Mg Tabs (Clorazepate dipotassium) .Marland KitchenMarland KitchenMarland Kitchen  4 times daily 2)  Clonazepam 0.5 Mg Tabs (Clonazepam) .... Three times a day 3)  Oxycodone-acetaminophen 10-650 Mg Tabs  (Oxycodone-acetaminophen) .... 4 times daily 4)  Diovan Hct 80-12.5 Mg Tabs (Valsartan-hydrochlorothiazide) .... Once daily 5)  Zanaflex 4 Mg Tabs (Tizanidine hcl) .Marland Kitchen.. 1 q 6 hours 6)  [facility]  .Marland Kitchen.. 1 tsp in mouth qid, rinse gargle and swallow 7)  Hydromet 5-1.5 Mg/27ml Syrp (Hydrocodone-homatropine) .... 2 tsp q4h as needed for cough 8)  Dilaudid 4 Mg Tabs (Hydromorphone hcl) .Marland Kitchen.. 1 q 6 hours as needed for breakthrough pain 9)  Neurontin 300 Mg Caps (Gabapentin) .... Two times a day  Patient Instructions: 1)  Try Dilaudid for breakthrough pains. She has developed some neuropathy in her feet. Will get labs today to look for an etiology. Try Neurontin for this.  2)  Please schedule a follow-up appointment in 2 weeks.  Prescriptions: NEURONTIN 300 MG CAPS (GABAPENTIN) two times a day  #60 x 5   Entered and Authorized by:   Nelwyn Salisbury MD   Signed by:   Nelwyn Salisbury MD on 05/02/2010   Method used:   Print then Give to Patient   RxID:   [id number] DILAUDID 4 MG TABS (HYDROMORPHONE HCL) 1 q 6 hours as needed for breakthrough pain  #120 x 0   Entered and Authorized by:   Nelwyn Salisbury MD   Signed by:   Nelwyn Salisbury MD on 05/02/2010   Method used:   Print then Give to Patient   RxID:   [id number] OXYCODONE-ACETAMINOPHEN 10-650 MG TABS (OXYCODONE-ACETAMINOPHEN) 4 times daily  #120 x 0   Entered and Authorized by:   Nelwyn Salisbury MD   Signed by:   Nelwyn Salisbury MD on 05/02/2010   Method used:   Print then Give to Patient   RxID:   [id number]

## 2010-11-02 ENCOUNTER — Telehealth: Payer: Self-pay | Admitting: Family Medicine

## 2010-11-02 DIAGNOSIS — F329 Major depressive disorder, single episode, unspecified: Secondary | ICD-10-CM

## 2010-11-02 MED ORDER — FLUOXETINE HCL 20 MG PO CAPS: 20.0000 mg | ORAL_CAPSULE | Freq: Two times a day (BID) | ORAL | Status: DC

## 2010-11-02 NOTE — Telephone Encounter (Signed)
Call this in for BID , #60 with 11 rf

## 2010-11-02 NOTE — Telephone Encounter (Signed)
Pt used to get Fluoxetine 20mg  bid   via Dr Allena Katz, but pt no longer sees that dr. Rock Nephew is req that Dr. Clent Ridges write this script [facility]

## 2010-11-03 NOTE — Progress Notes (Signed)
Summary: Pt req script for Oxycodone  Phone Note Refill Request Call back at [phone] Robin Message from:  daughter - Zella Ball Duehring  Refills Requested: Medication #1:  OXYCODONE-ACETAMINOPHEN 10-650 MG TABS 4 times daily   Supply Requested: 1 month Pls call when ready for pick up.    Method Requested: Pick up at Office Initial call taken by: Lucy Antigua,  September 27, 2010 1:52 PM  Follow-up for Phone Call        done  Follow-up by: Nelwyn Salisbury MD,  September 28, 2010 8:36 AM    Prescriptions: OXYCODONE-ACETAMINOPHEN 10-650 MG TABS (OXYCODONE-ACETAMINOPHEN) 4 times daily  #120 x 0   Entered and Authorized by:   Nelwyn Salisbury MD   Signed by:   Nelwyn Salisbury MD on 09/28/2010   Method used:   Print then Give to Patient   RxID:   [id number]  pt notified

## 2010-11-03 NOTE — Progress Notes (Signed)
Summary: Pt needs refill of Oxycodone.   Phone Note Refill Request Call back at [phone] [facility] from:  Daughter - Otto Herb on October 25, 2010 10:47 AM  Refills Requested: Medication #1:  OXYCODONE-ACETAMINOPHEN 10-650 MG TABS 4 times daily   Dosage confirmed as above?Dosage Confirmed   Supply Requested: 1 month  Method Requested: Pick up at Office Initial call taken by: Lucy Antigua,  October 25, 2010 10:46 AM  Follow-up for Phone Call        done  Follow-up by: Nelwyn Salisbury MD,  October 25, 2010 1:41 PM  Additional Follow-up for Phone Call Additional follow up Details #1::        pt notified.  Additional Follow-up by: Pura Spice, RN,  October 25, 2010 1:48 PM    Prescriptions: OXYCODONE-ACETAMINOPHEN 10-650 MG TABS (OXYCODONE-ACETAMINOPHEN) 4 times daily  #120 x 0   Entered and Authorized by:   Nelwyn Salisbury MD   Signed by:   Nelwyn Salisbury MD on 10/25/2010   Method used:   Print then Give to Patient   RxID:   [id number]

## 2010-11-03 NOTE — Progress Notes (Signed)
Summary: REFILL REQUEST  Phone Note Refill Request Message from:  Patient on October 17, 2010 12:10 PM  Refills Requested: Medication #1:  DILAUDID 4 MG TABS 1 q 6 hours as needed for breakthrough pain   Notes: Pts daughter can be reached at [phone]    Initial call taken by: Debbra Riding,  October 17, 2010 12:10 PM  Follow-up for Phone Call        done Follow-up by: Nelwyn Salisbury MD,  October 17, 2010 1:55 PM  Additional Follow-up for Phone Call Additional follow up Details #1::        daughter aware  Additional Follow-up by: Pura Spice, RN,  October 18, 2010 11:20 AM    Prescriptions: DILAUDID 4 MG TABS (HYDROMORPHONE HCL) 1 q 6 hours as needed for breakthrough pain  #120 x 0   Entered and Authorized by:   Nelwyn Salisbury MD   Signed by:   Nelwyn Salisbury MD on 10/17/2010   Method used:   Print then Give to Patient   RxID:   [id number]  No answer

## 2010-11-03 NOTE — Progress Notes (Signed)
Summary: Pls write script for Dilaudid and cancel Hydromet Syrup  Phone Note Refill Request Call back at [phone] Message from:  daughter - Otto Herb  on September 15, 2010 11:36 AM  Refills Requested: Medication #1:  DILAUDID 4 MG TABS 1 q 6 hours as needed for breakthrough pain   Dosage confirmed as above?Dosage Confirmed   Supply Requested: 1 month Pts daughter called and said that wrong med called in. It should have been Dilaudid. Pls cancel the the Hydromet Syrup at pharmacy.     Method Requested: Pick up at Office Initial call taken by: Lucy Antigua,  September 15, 2010 11:35 AM  Follow-up for Phone Call        done Follow-up by: Nelwyn Salisbury MD,  September 15, 2010 11:51 AM    Prescriptions: DILAUDID 4 MG TABS (HYDROMORPHONE HCL) 1 q 6 hours as needed for breakthrough pain  #120 x 0   Entered and Authorized by:   Nelwyn Salisbury MD   Signed by:   Nelwyn Salisbury MD on 09/15/2010   Method used:   Print then Give to Patient   RxID:   [id number]  Notified pt's daughter

## 2010-11-03 NOTE — Progress Notes (Signed)
Summary: FYI:  Missed Appt  Phone Note Other Incoming   Caller: [facility]  Summary of Call: FYI: Pt refused physical therapy treatment today because she needed to get her nails done. Initial call taken by: Trixie Dredge,  October 06, 2010 12:03 PM  Follow-up for Phone Call        noted  Follow-up by: Nelwyn Salisbury MD,  October 07, 2010 1:20 PM

## 2010-11-03 NOTE — Progress Notes (Signed)
Summary: new rx  Phone Note Refill Request Call back at Home Phone [phone] Message from:  other-daug-robin  Refills Requested: Medication #1:  HYDROMET 5-1.5 MG/5ML SYRP 1 TSP Q4H as needed FOR COUGH please call daugh when ready for pick up  Initial call taken by: Heron Sabins,  September 14, 2010 3:44 PM  Follow-up for Phone Call        call in 240 ml with no rf Follow-up by: Nelwyn Salisbury MD,  September 14, 2010 5:21 PM  Additional Follow-up for Phone Call Additional follow up Details #1::        [facility] Left message for pt's daughter to callback office Additional Follow-up by: Brenton Grills CMA Duncan Dull),  September 14, 2010 5:30 PM    Prescriptions: HYDROMET 5-1.5 MG/5ML SYRP (HYDROCODONE-HOMATROPINE) 1 TSP Q4H as needed FOR COUGH  #240 x 0   Entered by:   Brenton Grills CMA (AAMA)   Authorized by:   Nelwyn Salisbury MD   Signed by:   Brenton Grills CMA (AAMA) on 09/14/2010   Method used:   [facility] to ...       [facility] [address] (retail)       [address]       [place], [place]  [address]       Ph: [phone] or [phone]       Fax: [phone]   RxID:   [id number]

## 2010-11-09 NOTE — Miscellaneous (Signed)
Summary: Physician's Orders/[facility]  Physician's Orders/[facility] [place]   Imported By: Maryln Gottron 11/01/2010 14:38:12  _____________________________________________________________________  External Attachment:    Type:   Image     Comment:   External Document

## 2010-11-09 NOTE — Miscellaneous (Signed)
Summary: Certification and Plan of Care/[facility]  Certification and Plan of Care/[facility] [place]   Imported By: Maryln Gottron 10/31/2010 11:23:08  _____________________________________________________________________  External Attachment:    Type:   Image     Comment:   External Document

## 2010-11-09 NOTE — Miscellaneous (Signed)
Summary: Physician's Orders/[facility]  Physician's Orders/[facility] [place]   Imported By: Maryln Gottron 11/03/2010 12:34:11  _____________________________________________________________________  External Attachment:    Type:   Image     Comment:   External Document

## 2010-11-09 NOTE — Miscellaneous (Signed)
Summary: Physician's Orders/[facility] [place]  Physician's Orders/[facility] [place]   Imported By: Maryln Gottron 11/04/2010 12:12:59  _____________________________________________________________________  External Attachment:    Type:   Image     Comment:   External Document

## 2010-11-10 ENCOUNTER — Telehealth: Payer: Self-pay | Admitting: Family Medicine

## 2010-11-10 DIAGNOSIS — M549 Dorsalgia, unspecified: Secondary | ICD-10-CM

## 2010-11-10 DIAGNOSIS — F329 Major depressive disorder, single episode, unspecified: Secondary | ICD-10-CM

## 2010-11-10 NOTE — Telephone Encounter (Signed)
Pt daughter called to check on status of Diluadid (Hydromorphone 4mg ) and also Fluoxetine. Pts daughter req to pick up written script on both. Pls call when ready. Pt daughter going out of town. Need to pick up asap.

## 2010-11-10 NOTE — Telephone Encounter (Signed)
Pt needs generic prozac 20 mg twice a day and hydromorphone 4 mg. Daughter would like to pick up rxs today.

## 2010-11-11 MED ORDER — HYDROMORPHONE HCL 4 MG PO TABS: 4.0000 mg | ORAL_TABLET | Freq: Four times a day (QID) | ORAL | Status: AC | PRN

## 2010-11-11 MED ORDER — FLUOXETINE HCL 20 MG PO CAPS
20.0000 mg | ORAL_CAPSULE | Freq: Two times a day (BID) | ORAL | Status: DC
Start: 2010-11-11 — End: 2014-10-08

## 2010-11-11 NOTE — Telephone Encounter (Signed)
Left mess for Karen Hardy rx ready for pick up

## 2010-11-11 NOTE — Telephone Encounter (Signed)
done

## 2010-11-22 ENCOUNTER — Other Ambulatory Visit: Payer: Self-pay | Admitting: Family Medicine

## 2010-11-22 NOTE — Telephone Encounter (Signed)
Pts daughter called to req script for Oxycodone Aceta 10-650 mg. Pls call when ready for pick up.

## 2010-11-23 MED ORDER — OXYCODONE-ACETAMINOPHEN 10-650 MG PO TABS: 1.0000 | ORAL_TABLET | Freq: Four times a day (QID) | ORAL | Status: AC | PRN

## 2010-11-23 NOTE — Telephone Encounter (Signed)
Done

## 2010-11-23 NOTE — Telephone Encounter (Signed)
Daughter aware rx ready for pick up (oxycodone)

## 2010-12-08 ENCOUNTER — Other Ambulatory Visit: Payer: Self-pay | Admitting: *Deleted

## 2010-12-08 MED ORDER — CLORAZEPATE DIPOTASSIUM 7.5 MG PO TABS: 7.5000 mg | ORAL_TABLET | Freq: Four times a day (QID) | ORAL | Status: AC | PRN

## 2010-12-13 ENCOUNTER — Telehealth: Payer: Self-pay | Admitting: Family Medicine

## 2010-12-13 LAB — AMMONIA
Ammonia: 27 umol/L (ref 11–35)
Ammonia: 63 umol/L — ABNORMAL HIGH (ref 11–35)

## 2010-12-13 LAB — URINALYSIS, ROUTINE W REFLEX MICROSCOPIC
Nitrite: NEGATIVE
Protein, ur: NEGATIVE mg/dL
Specific Gravity, Urine: 1.022 (ref 1.005–1.030)
Urobilinogen, UA: 1 mg/dL (ref 0.0–1.0)
pH: 6 (ref 5.0–8.0)

## 2010-12-13 LAB — PROTIME-INR
INR: 1.04 (ref 0.00–1.49)
Prothrombin Time: 13.8 seconds (ref 11.6–15.2)

## 2010-12-13 LAB — CBC
HCT: 31.8 % — ABNORMAL LOW (ref 36.0–46.0)
HCT: 39 % (ref 36.0–46.0)
Hemoglobin: 10.9 g/dL — ABNORMAL LOW (ref 12.0–15.0)
Hemoglobin: 11.1 g/dL — ABNORMAL LOW (ref 12.0–15.0)
Hemoglobin: 12.9 g/dL (ref 12.0–15.0)
MCH: 30.1 pg (ref 26.0–34.0)
MCH: 30.7 pg (ref 26.0–34.0)
MCH: 30.7 pg (ref 26.0–34.0)
MCHC: 33.1 g/dL (ref 30.0–36.0)
MCHC: 33.9 g/dL (ref 30.0–36.0)
MCHC: 35.3 g/dL (ref 30.0–36.0)
MCV: 87.6 fL (ref 78.0–100.0)
MCV: 90.7 fL (ref 78.0–100.0)
MCV: 91.1 fL (ref 78.0–100.0)
Platelets: 108 10*3/uL — ABNORMAL LOW (ref 150–400)
Platelets: 131 10*3/uL — ABNORMAL LOW (ref 150–400)
Platelets: 165 10*3/uL (ref 150–400)
Platelets: 94 10*3/uL — ABNORMAL LOW (ref 150–400)
RBC: 3.62 MIL/uL — ABNORMAL LOW (ref 3.87–5.11)
RBC: 4.28 MIL/uL (ref 3.87–5.11)
RDW: 13.3 % (ref 11.5–15.5)
RDW: 13.4 % (ref 11.5–15.5)
RDW: 14 % (ref 11.5–15.5)
WBC: 5.9 10*3/uL (ref 4.0–10.5)
WBC: 7.7 10*3/uL (ref 4.0–10.5)

## 2010-12-13 LAB — COMPREHENSIVE METABOLIC PANEL
ALT: 115 U/L — ABNORMAL HIGH (ref 0–35)
ALT: 54 U/L — ABNORMAL HIGH (ref 0–35)
ALT: 56 U/L — ABNORMAL HIGH (ref 0–35)
AST: 43 U/L — ABNORMAL HIGH (ref 0–37)
AST: 47 U/L — ABNORMAL HIGH (ref 0–37)
AST: 47 U/L — ABNORMAL HIGH (ref 0–37)
Albumin: 2.1 g/dL — ABNORMAL LOW (ref 3.5–5.2)
Albumin: 2.3 g/dL — ABNORMAL LOW (ref 3.5–5.2)
Alkaline Phosphatase: 214 U/L — ABNORMAL HIGH (ref 39–117)
Alkaline Phosphatase: 260 U/L — ABNORMAL HIGH (ref 39–117)
BUN: 1 mg/dL — ABNORMAL LOW (ref 6–23)
CO2: 23 mEq/L (ref 19–32)
CO2: 24 mEq/L (ref 19–32)
Calcium: 9.1 mg/dL (ref 8.4–10.5)
Calcium: 9.1 mg/dL (ref 8.4–10.5)
Chloride: 109 mEq/L (ref 96–112)
Chloride: 113 mEq/L — ABNORMAL HIGH (ref 96–112)
Creatinine, Ser: 0.56 mg/dL (ref 0.4–1.2)
Creatinine, Ser: 0.57 mg/dL (ref 0.4–1.2)
Creatinine, Ser: 0.59 mg/dL (ref 0.4–1.2)
Creatinine, Ser: 0.66 mg/dL (ref 0.4–1.2)
GFR calc Af Amer: >60 mL/min (ref >60)
GFR calc Af Amer: >60 mL/min (ref >60)
GFR calc Af Amer: >60 mL/min (ref >60)
GFR calc Af Amer: >60 mL/min (ref >60)
GFR calc non Af Amer: >60 mL/min (ref >60)
GFR calc non Af Amer: >60 mL/min (ref >60)
GFR calc non Af Amer: >60 mL/min (ref >60)
Glucose, Bld: 102 mg/dL — ABNORMAL HIGH (ref 70–99)
Glucose, Bld: 130 mg/dL — ABNORMAL HIGH (ref 70–99)
Potassium: 3.1 mEq/L — ABNORMAL LOW (ref 3.5–5.1)
Sodium: 139 mEq/L (ref 135–145)
Sodium: 140 mEq/L (ref 135–145)
Total Bilirubin: 0.9 mg/dL (ref 0.3–1.2)
Total Bilirubin: 1 mg/dL (ref 0.3–1.2)
Total Bilirubin: 1 mg/dL (ref 0.3–1.2)
Total Bilirubin: 1.1 mg/dL (ref 0.3–1.2)
Total Bilirubin: 1.1 mg/dL (ref 0.3–1.2)
Total Protein: 4.8 g/dL — ABNORMAL LOW (ref 6.0–8.3)
Total Protein: 5.2 g/dL — ABNORMAL LOW (ref 6.0–8.3)

## 2010-12-13 LAB — HEPATITIS PANEL, ACUTE
HCV Ab: NEGATIVE
HCV Ab: NEGATIVE
Hep A IgM: NEGATIVE
Hep A IgM: NEGATIVE
Hepatitis B Surface Ag: NEGATIVE

## 2010-12-13 LAB — CULTURE, BLOOD (ROUTINE X 2): Culture: NO GROWTH

## 2010-12-13 LAB — HEPATIC FUNCTION PANEL
AST: 170 U/L — ABNORMAL HIGH (ref 0–37)
Albumin: 2.3 g/dL — ABNORMAL LOW (ref 3.5–5.2)
Indirect Bilirubin: 1.1 mg/dL — ABNORMAL HIGH (ref 0.3–0.9)

## 2010-12-13 LAB — FOLATE: Folate: 16.4 ng/mL

## 2010-12-13 LAB — GLUCOSE, CAPILLARY: Glucose-Capillary: 165 mg/dL — ABNORMAL HIGH (ref 70–99)

## 2010-12-13 LAB — BASIC METABOLIC PANEL
BUN: 11 mg/dL (ref 6–23)
BUN: 3 mg/dL — ABNORMAL LOW (ref 6–23)
Chloride: 110 mEq/L (ref 96–112)
GFR calc Af Amer: >60 mL/min (ref >60)
Glucose, Bld: 110 mg/dL — ABNORMAL HIGH (ref 70–99)
Potassium: 3.7 mEq/L (ref 3.5–5.1)
Sodium: 139 mEq/L (ref 135–145)

## 2010-12-13 LAB — DIFFERENTIAL
Basophils Absolute: 0 10*3/uL (ref 0.0–0.1)
Basophils Relative: 0 % (ref 0–1)
Eosinophils Absolute: 0 10*3/uL (ref 0.0–0.7)

## 2010-12-13 LAB — TROPONIN I: Troponin I: 0.04 ng/mL (ref 0.00–0.06)

## 2010-12-13 LAB — ACETAMINOPHEN LEVEL: Acetaminophen (Tylenol), Serum: <10 ug/mL — ABNORMAL LOW (ref 10–30)

## 2010-12-13 LAB — IRON AND TIBC: Saturation Ratios: 27 % (ref 20–55)

## 2010-12-13 LAB — VITAMIN B12: Vitamin B-12: 1310 pg/mL — ABNORMAL HIGH (ref 211–911)

## 2010-12-13 LAB — URINE CULTURE: Colony Count: NO GROWTH

## 2010-12-13 LAB — FERRITIN: Ferritin: 332 ng/mL — ABNORMAL HIGH (ref 10–291)

## 2010-12-13 NOTE — Telephone Encounter (Signed)
Refill oxycodone aceto and hydromorphone. Not due until next week.

## 2010-12-14 ENCOUNTER — Encounter: Payer: Self-pay | Admitting: Family Medicine

## 2010-12-14 MED ORDER — OXYCODONE-ACETAMINOPHEN 10-650 MG PO TABS: 1.0000 | ORAL_TABLET | Freq: Four times a day (QID) | ORAL | Status: DC | PRN

## 2010-12-14 MED ORDER — HYDROMORPHONE HCL 4 MG PO TABS: 4.0000 mg | ORAL_TABLET | Freq: Four times a day (QID) | ORAL | Status: DC | PRN

## 2010-12-14 NOTE — Telephone Encounter (Signed)
done

## 2010-12-15 ENCOUNTER — Ambulatory Visit (INDEPENDENT_AMBULATORY_CARE_PROVIDER_SITE_OTHER): Payer: Medicare Other | Admitting: Family Medicine

## 2010-12-15 ENCOUNTER — Encounter: Payer: Self-pay | Admitting: Family Medicine

## 2010-12-15 VITALS — BP 120/80 | Temp 98.5°F | Wt 129.0 lb

## 2010-12-15 DIAGNOSIS — J209 Acute bronchitis, unspecified: Secondary | ICD-10-CM

## 2010-12-15 DIAGNOSIS — R05 Cough: Secondary | ICD-10-CM

## 2010-12-15 DIAGNOSIS — G894 Chronic pain syndrome: Secondary | ICD-10-CM

## 2010-12-15 MED ORDER — HYDROMORPHONE HCL 4 MG PO TABS: 4.0000 mg | ORAL_TABLET | Freq: Four times a day (QID) | ORAL | Status: AC | PRN

## 2010-12-15 MED ORDER — OXYCODONE-ACETAMINOPHEN 10-650 MG PO TABS: 1.0000 | ORAL_TABLET | Freq: Four times a day (QID) | ORAL | Status: AC | PRN

## 2010-12-15 MED ORDER — AZITHROMYCIN 250 MG PO TABS: ORAL_TABLET | ORAL | Status: AC

## 2010-12-15 NOTE — Progress Notes (Signed)
  Subjective:    Patient ID: Karen Hardy, female    DOB: [birth year], 70 y.o.   MRN: [id number]  HPI  patient seen as a work in with cough 2 week duration. Family concerned because of 2 prior episodes of pneumonia. Cough productive of green sputum. Symptoms worsening over the 2 weeks. No fever. No history of smoking. Does have some postnasal drip symptoms. No history of asthma.   Chronic pain syndrome treated with Dilaudid and Percocet. Family requesting refill today. They have conflict with picking up next week. They know not to fill early.   Review of Systems  Constitutional: Negative for fever, chills, appetite change and unexpected weight change.  HENT: Positive for congestion and postnasal drip. Negative for ear pain.   Respiratory: Positive for cough. Negative for chest tightness, shortness of breath and wheezing.   Cardiovascular: Negative for chest pain and leg swelling.       Objective:   Physical Exam  the patient is alert nontoxic in appearance. Afebrile.   oropharynx is moist and clear Eardrums no acute changes Neck supple no adenopathy Chest clear to auscultation Heart regular rhythm and rate      Assessment & Plan:   acute bronchitis. Given her age and persistence of symptoms start Zithromax. One-time refill given for her pain medications. Subsequent followup with primary physician.

## 2010-12-27 ENCOUNTER — Other Ambulatory Visit: Payer: Self-pay

## 2010-12-27 NOTE — Telephone Encounter (Signed)
Refill clonazepam 0.5 mg

## 2010-12-28 MED ORDER — CLONAZEPAM 0.5 MG PO TABS: 0.5000 mg | ORAL_TABLET | Freq: Four times a day (QID) | ORAL | Status: DC

## 2010-12-28 NOTE — Telephone Encounter (Signed)
rx clon[facility]epam [facility]

## 2010-12-28 NOTE — Telephone Encounter (Signed)
This is QID, call in #120 with 5 rf

## 2011-01-11 ENCOUNTER — Telehealth: Payer: Self-pay | Admitting: Family Medicine

## 2011-01-11 NOTE — Telephone Encounter (Signed)
Refill Hydromorphone 5 mg. Please call when ready.

## 2011-01-12 NOTE — Telephone Encounter (Signed)
Daughter  Zella Ball aware that Dr Clent Ridges out til Monday.

## 2011-01-16 ENCOUNTER — Other Ambulatory Visit: Payer: Self-pay | Admitting: Family Medicine

## 2011-01-16 MED ORDER — HYDROMORPHONE HCL 4 MG PO TABS: 4.0000 mg | ORAL_TABLET | Freq: Four times a day (QID) | ORAL | Status: AC | PRN

## 2011-01-16 NOTE — Telephone Encounter (Signed)
done

## 2011-01-16 NOTE — Telephone Encounter (Signed)
Pt needs new script for Oxycodone Acet(Percocet)10-650 for pick on Friday 01/20/11.

## 2011-01-16 NOTE — Telephone Encounter (Signed)
Daughter aware rx ready for pick up 

## 2011-01-17 ENCOUNTER — Other Ambulatory Visit: Payer: Self-pay

## 2011-01-17 MED ORDER — OXYCODONE-ACETAMINOPHEN 10-650 MG PO TABS: 1.0000 | ORAL_TABLET | Freq: Four times a day (QID) | ORAL | Status: AC | PRN

## 2011-01-17 NOTE — Telephone Encounter (Signed)
Done

## 2011-01-17 NOTE — Telephone Encounter (Signed)
Left mess rx ready for pick up

## 2011-01-19 MED ORDER — TIZANIDINE HCL 4 MG PO TABS: 4.0000 mg | ORAL_TABLET | Freq: Four times a day (QID) | ORAL | Status: DC | PRN

## 2011-01-19 NOTE — Telephone Encounter (Signed)
rx tizanidine 4mg  [facility]

## 2011-01-19 NOTE — Telephone Encounter (Signed)
Call in #120 with 5 rf 

## 2011-01-21 ENCOUNTER — Other Ambulatory Visit: Payer: Self-pay | Admitting: Family Medicine

## 2011-01-26 NOTE — Telephone Encounter (Signed)
Please advise 

## 2011-02-02 NOTE — Telephone Encounter (Signed)
Done

## 2011-02-02 NOTE — Telephone Encounter (Signed)
Call in 240 ml with 5 rf

## 2011-02-13 ENCOUNTER — Telehealth: Payer: Self-pay | Admitting: Family Medicine

## 2011-02-13 MED ORDER — OXYCODONE-ACETAMINOPHEN 10-650 MG PO TABS: 1.0000 | ORAL_TABLET | Freq: Four times a day (QID) | ORAL | Status: AC | PRN

## 2011-02-13 MED ORDER — HYDROMORPHONE HCL 4 MG PO TABS: 4.0000 mg | ORAL_TABLET | Freq: Four times a day (QID) | ORAL | Status: AC | PRN

## 2011-02-13 NOTE — Telephone Encounter (Signed)
These were given to her daughter, Zella Ball

## 2011-02-28 ENCOUNTER — Encounter: Payer: Self-pay | Admitting: Internal Medicine

## 2011-02-28 ENCOUNTER — Ambulatory Visit (INDEPENDENT_AMBULATORY_CARE_PROVIDER_SITE_OTHER): Payer: Medicare Other | Admitting: Internal Medicine

## 2011-02-28 VITALS — BP 100/60 | HR 82 | Temp 97.2°F

## 2011-02-28 DIAGNOSIS — R0781 Pleurodynia: Secondary | ICD-10-CM

## 2011-02-28 DIAGNOSIS — R079 Chest pain, unspecified: Secondary | ICD-10-CM

## 2011-02-28 DIAGNOSIS — G8929 Other chronic pain: Secondary | ICD-10-CM

## 2011-02-28 DIAGNOSIS — R071 Chest pain on breathing: Secondary | ICD-10-CM

## 2011-02-28 DIAGNOSIS — R0789 Other chest pain: Secondary | ICD-10-CM

## 2011-02-28 MED ORDER — OXYCODONE HCL 10 MG PO TABS: 10.0000 mg | ORAL_TABLET | Freq: Four times a day (QID) | ORAL | Status: DC | PRN

## 2011-02-28 MED ORDER — KETOROLAC TROMETHAMINE 30 MG/ML IJ SOLN
30.0000 mg | Freq: Once | INTRAMUSCULAR | Status: AC
  Administered 2011-02-28: 30 mg via INTRAMUSCULAR

## 2011-02-28 MED ORDER — DICLOFENAC SODIUM 50 MG PO TBEC: DELAYED_RELEASE_TABLET | ORAL | Status: AC

## 2011-03-05 ENCOUNTER — Encounter: Payer: Self-pay | Admitting: Internal Medicine

## 2011-03-05 DIAGNOSIS — G8929 Other chronic pain: Secondary | ICD-10-CM | POA: Insufficient documentation

## 2011-03-05 DIAGNOSIS — R0789 Other chest pain: Secondary | ICD-10-CM | POA: Insufficient documentation

## 2011-03-05 NOTE — Assessment & Plan Note (Signed)
Status post fall. Discussed radiograph however if rib fracture present treatment would be unchanged. Recommend treatment of underlying pain. Toradol 30 mg IM today. Begin anti-inflammatory with food. Followup if no improvement or worsening.

## 2011-03-05 NOTE — Progress Notes (Signed)
  Subjective:    Patient ID: Karen Hardy, female    DOB: 03-24-1941, 70 y.o.   MRN: [id number]  HPI Patient presents to clinic for evaluation of rib pain. History obtained from patient as well as daughter who assists with patient's permission. Approximately one week ago fell striking her right anterior lateral chest wall against a hard surface. Has had subsequent chest wall pain without shortness of breath. Pain is worsened with movement and deep inspiration. No other exacerbating or alleviating factors. Also suffers from chronic pain and has long-standing use of Dilaudid and Percocet. Patient and family are concerned over possible tolerance that is developing and also over the amount of Tylenol use with Percocet. His concern of her Tylenol use sometimes curtails her use of the Percocet during the day. No other complaints  Reviewed past medical history, medications and allergies    Review of Systems  Constitutional: Negative for fever and chills.  Respiratory: Negative for shortness of breath and wheezing.   Cardiovascular: Positive for chest pain.  Musculoskeletal: Negative for myalgias, back pain and arthralgias.       Objective:   Physical Exam  [nursing notereviewed. Constitutional: She appears well-developed and well-nourished. No distress.  HENT:  Head: Normocephalic and atraumatic.  Right Ear: External ear normal.  Left Ear: External ear normal.  Nose: Nose normal.  Eyes: Conjunctivae are normal. No scleral icterus.  Pulmonary/Chest: Effort normal and breath sounds normal. No respiratory distress. She has no wheezes. She has no rales. She exhibits tenderness.  Musculoskeletal:       Right anterior lateral chest wall tenderness without bony abnormality  Neurological: She is alert.  Skin: Skin is warm and dry. She is not diaphoretic.  Psychiatric: She has a normal mood and affect.          Assessment & Plan:

## 2011-03-05 NOTE — Assessment & Plan Note (Signed)
Discussion held with patient and daughter. Reviewed potential for addiction and her tolerance with long-term narcotic use. Discussed multiple options including pain clinic referral and/or long acting narcotic prescriptions. Ultimately do strongly recommend she discuss this with her primary care physician and they're agreeable. Will change Percocet to oxycodone to alleviate concerns over overuse of Tylenol.

## 2011-03-06 ENCOUNTER — Telehealth: Payer: Self-pay | Admitting: Family Medicine

## 2011-03-06 NOTE — Telephone Encounter (Signed)
Pt req refill of HYDROmorphone (DILAUDID) 4 mg.

## 2011-03-07 ENCOUNTER — Telehealth: Payer: Self-pay | Admitting: *Deleted

## 2011-03-07 MED ORDER — HYDROMORPHONE HCL 4 MG PO TABS: 4.0000 mg | ORAL_TABLET | Freq: Four times a day (QID) | ORAL | Status: DC | PRN

## 2011-03-07 MED ORDER — HYDROMORPHONE HCL 4 MG PO TABS: 4.0000 mg | ORAL_TABLET | Freq: Four times a day (QID) | ORAL | Status: AC | PRN

## 2011-03-07 NOTE — Telephone Encounter (Signed)
Done

## 2011-03-07 NOTE — Telephone Encounter (Signed)
done

## 2011-03-07 NOTE — Telephone Encounter (Signed)
LMOM to inform Pt that Rx is ready for p/u at front office between 8am-5pm

## 2011-03-07 NOTE — Telephone Encounter (Signed)
Pt daugh Karen Hardy is aware rx ready for pick up

## 2011-03-21 ENCOUNTER — Telehealth: Payer: Self-pay | Admitting: Family Medicine

## 2011-03-21 ENCOUNTER — Ambulatory Visit (INDEPENDENT_AMBULATORY_CARE_PROVIDER_SITE_OTHER): Payer: Medicare Other | Admitting: Family Medicine

## 2011-03-21 ENCOUNTER — Encounter: Payer: Self-pay | Admitting: Family Medicine

## 2011-03-21 VITALS — BP 120/90 | Temp 98.5°F

## 2011-03-21 DIAGNOSIS — G8929 Other chronic pain: Secondary | ICD-10-CM

## 2011-03-21 DIAGNOSIS — M549 Dorsalgia, unspecified: Secondary | ICD-10-CM

## 2011-03-21 DIAGNOSIS — J329 Chronic sinusitis, unspecified: Secondary | ICD-10-CM

## 2011-03-21 MED ORDER — HYDROMORPHONE HCL 8 MG PO TABS: 8.0000 mg | ORAL_TABLET | Freq: Four times a day (QID) | ORAL | Status: AC | PRN

## 2011-03-21 MED ORDER — OXYCODONE HCL 10 MG PO TABS: 10.0000 mg | ORAL_TABLET | Freq: Four times a day (QID) | ORAL | Status: DC | PRN

## 2011-03-21 MED ORDER — AMOXICILLIN-POT CLAVULANATE 875-125 MG PO TABS: 1.0000 | ORAL_TABLET | Freq: Two times a day (BID) | ORAL | Status: AC

## 2011-03-21 NOTE — Telephone Encounter (Signed)
Pharmacist called and said that they rcvd a written script for Diludid 8 mg, but pt just rcvd a refill of the Diludid 4 mg on 03/16/11. Pharmacist wants to know if pt can just take 2 of the 4 mg, until script runs out and then get the 8 mg filled.

## 2011-03-21 NOTE — Progress Notes (Signed)
  Subjective:    Patient ID: Karen Hardy, female    DOB: 18-Apr-1941, 70 y.o.   MRN: [id number]  HPI Here with her daughter for 2 weeks of sinus pressure, HA, PND, nausea, and diarrhea. No fever. Phenergan helps. Drinking fluids. Also her chronic pain has been getting worse, and her meds no longer work well. She has not seen a pain specialist for 5 years or so.    Review of Systems  Constitutional: Negative.   HENT: Positive for congestion, postnasal drip and sinus pressure.   Eyes: Negative.   Respiratory: Positive for cough.   Gastrointestinal: Positive for nausea and diarrhea.  Musculoskeletal: Positive for myalgias and back pain.       Objective:   Physical Exam  Constitutional: She appears well-developed and well-nourished.  HENT:  Right Ear: External ear normal.  Left Ear: External ear normal.  Nose: Nose normal.  Mouth/Throat: Oropharynx is clear and moist. No oropharyngeal exudate.  Eyes: Conjunctivae are normal. Pupils are equal, round, and reactive to light.  Neck: No thyromegaly present.  Pulmonary/Chest: Effort normal and breath sounds normal. No respiratory distress. She has no wheezes. She has no rales. She exhibits no tenderness.  Abdominal: Soft. Bowel sounds are normal. She exhibits no distension and no mass. There is no tenderness. There is no rebound and no guarding.  Lymphadenopathy:    She has no cervical adenopathy.          Assessment & Plan:  Refill her pain meds but increase the Dilaudid to 8 mg 4 times a day. Refer to Dr. Janalyn Shy for pain management. Treat the sinus infection.

## 2011-03-22 NOTE — Telephone Encounter (Signed)
Yes have her take two of the 4 mg tabs together to use these up before filling the 8 mg rx

## 2011-03-22 NOTE — Telephone Encounter (Signed)
Pharmacy aware

## 2011-04-20 ENCOUNTER — Telehealth: Payer: Self-pay | Admitting: Family Medicine

## 2011-04-20 ENCOUNTER — Encounter: Payer: Medicare Other | Admitting: Family Medicine

## 2011-04-20 NOTE — Telephone Encounter (Signed)
Requesting refill on   Oxycodone HCl 10 MG TABS [87795]   Missed Cpx today was r/s 05/24/11 at 10:00 will run out of meds before then.

## 2011-04-20 NOTE — Telephone Encounter (Signed)
Refill request for Oxycodone, pt last here on 03/21/11 and script last filled on 03/21/11.

## 2011-04-21 MED ORDER — OXYCODONE HCL 10 MG PO TABS: 10.0000 mg | ORAL_TABLET | Freq: Four times a day (QID) | ORAL | Status: DC | PRN

## 2011-04-21 NOTE — Telephone Encounter (Signed)
done

## 2011-05-08 ENCOUNTER — Telehealth: Payer: Self-pay | Admitting: Family Medicine

## 2011-05-08 NOTE — Telephone Encounter (Signed)
Pt's daughter requesting a refill on her mother's Hydromorphone 8mg . Please call her when she can come and pick it up.

## 2011-05-09 NOTE — Telephone Encounter (Signed)
I think she is requesting oxycodone 10mg  refill - last seen 03/21/11  Last written 04/21/11 #120 -0RF Please advise

## 2011-05-10 MED ORDER — HYDROMORPHONE HCL 8 MG PO TABS: 8.0000 mg | ORAL_TABLET | Freq: Four times a day (QID) | ORAL | Status: AC | PRN

## 2011-05-10 NOTE — Telephone Encounter (Signed)
This was for Dilaudid. This was done

## 2011-05-10 NOTE — Telephone Encounter (Signed)
Script ready and pt aware.

## 2011-05-22 ENCOUNTER — Telehealth: Payer: Self-pay | Admitting: Family Medicine

## 2011-05-22 NOTE — Telephone Encounter (Signed)
Pt coming in for cpe on Wed 05/24/11. Pt has a bladder inf and is running low grade fever. Pt req an antibiotic to be called in to [facility], since pt is already sch for ov this wk.

## 2011-05-22 NOTE — Telephone Encounter (Signed)
pls advise

## 2011-05-22 NOTE — Telephone Encounter (Signed)
Pt requesting refill on Oxycodone HCl 10 MG TABS  ° °

## 2011-05-23 NOTE — Telephone Encounter (Signed)
She will need to be seen for this

## 2011-05-24 ENCOUNTER — Encounter: Payer: Self-pay | Admitting: Family Medicine

## 2011-05-24 ENCOUNTER — Ambulatory Visit (INDEPENDENT_AMBULATORY_CARE_PROVIDER_SITE_OTHER): Payer: Medicare Other | Admitting: Family Medicine

## 2011-05-24 VITALS — BP 100/62 | HR 95 | Temp 99.5°F

## 2011-05-24 DIAGNOSIS — F419 Anxiety disorder, unspecified: Secondary | ICD-10-CM

## 2011-05-24 DIAGNOSIS — F411 Generalized anxiety disorder: Secondary | ICD-10-CM

## 2011-05-24 DIAGNOSIS — Z136 Encounter for screening for cardiovascular disorders: Secondary | ICD-10-CM

## 2011-05-24 DIAGNOSIS — M549 Dorsalgia, unspecified: Secondary | ICD-10-CM

## 2011-05-24 DIAGNOSIS — IMO0002 Reserved for concepts with insufficient information to code with codable children: Secondary | ICD-10-CM

## 2011-05-24 DIAGNOSIS — G629 Polyneuropathy, unspecified: Secondary | ICD-10-CM

## 2011-05-24 DIAGNOSIS — Q203 Discordant ventriculoarterial connection: Secondary | ICD-10-CM

## 2011-05-24 DIAGNOSIS — G589 Mononeuropathy, unspecified: Secondary | ICD-10-CM

## 2011-05-24 DIAGNOSIS — I10 Essential (primary) hypertension: Secondary | ICD-10-CM

## 2011-05-24 LAB — HEPATIC FUNCTION PANEL
ALT: 16 U/L (ref 0–35)
AST: 25 U/L (ref 0–37)
Alkaline Phosphatase: 88 U/L (ref 39–117)
Bilirubin, Direct: 0.1 mg/dL (ref 0.0–0.3)
Total Bilirubin: 0.7 mg/dL (ref 0.3–1.2)

## 2011-05-24 LAB — LIPID PANEL: Triglycerides: 180 mg/dL — ABNORMAL HIGH (ref 0.0–149.0)

## 2011-05-24 LAB — CBC WITH DIFFERENTIAL/PLATELET
Basophils Absolute: 0 10*3/uL (ref 0.0–0.1)
Basophils Relative: 0.6 % (ref 0.0–3.0)
Eosinophils Absolute: 0.3 10*3/uL (ref 0.0–0.7)
HCT: 39.5 % (ref 36.0–46.0)
Hemoglobin: 13.1 g/dL (ref 12.0–15.0)
Lymphs Abs: 2.7 10*3/uL (ref 0.7–4.0)
MCHC: 33.2 g/dL (ref 30.0–36.0)
Neutro Abs: 3.7 10*3/uL (ref 1.4–7.7)
RBC: 4.39 Mil/uL (ref 3.87–5.11)
RDW: 14.1 % (ref 11.5–14.6)

## 2011-05-24 LAB — POCT URINALYSIS DIPSTICK
Blood, UA: NEGATIVE
Ketones, UA: NEGATIVE
Spec Grav, UA: 1.03
pH, UA: 5.5

## 2011-05-24 LAB — BASIC METABOLIC PANEL
BUN: 17 mg/dL (ref 6–23)
Chloride: 105 mEq/L (ref 96–112)
Glucose, Bld: 85 mg/dL (ref 70–99)
Potassium: 4.1 mEq/L (ref 3.5–5.1)
Sodium: 141 mEq/L (ref 135–145)

## 2011-05-24 MED ORDER — HYDROMORPHONE HCL 8 MG PO TABS: 8.0000 mg | ORAL_TABLET | Freq: Three times a day (TID) | ORAL | Status: DC | PRN

## 2011-05-24 MED ORDER — CIPROFLOXACIN HCL 500 MG PO TABS: 500.0000 mg | ORAL_TABLET | Freq: Two times a day (BID) | ORAL | Status: AC

## 2011-05-24 MED ORDER — OXYCODONE HCL 10 MG PO TABS: 10.0000 mg | ORAL_TABLET | Freq: Four times a day (QID) | ORAL | Status: DC | PRN

## 2011-05-24 MED ORDER — KETOROLAC TROMETHAMINE 60 MG/2ML IM SOLN
60.0000 mg | Freq: Once | INTRAMUSCULAR | Status: AC
  Administered 2011-05-24: 60 mg via INTRAMUSCULAR

## 2011-05-24 MED ORDER — CLONAZEPAM 0.5 MG PO TABS: 0.5000 mg | ORAL_TABLET | Freq: Four times a day (QID) | ORAL | Status: DC

## 2011-05-24 NOTE — Progress Notes (Signed)
  Subjective:    Patient ID: Karen Hardy, female    DOB: 12-Aug-1941, 70 y.o.   MRN: [id number]  HPI 70 yr old female for a cpx. Here with her daughter. She has several complaints today. Her overall pain levels have gotten worse over the past 6 months, and they ask if we can increase her pain meds at all. We tried to refer her to [facility] a few months ago, but they turned her down. They would like to try to see another pain clinic. Also for 2 weeks she has had lower pelvic pressure, burning on urinations, and urgency. No fever or nausea. Drinking plenty of fluids.    Review of Systems  Constitutional: Negative.   HENT: Negative.   Eyes: Negative.   Respiratory: Negative.   Cardiovascular: Negative.   Gastrointestinal: Negative.   Genitourinary: Negative for dysuria, urgency, frequency, hematuria, flank pain, decreased urine volume, enuresis, difficulty urinating, pelvic pain and dyspareunia.  Musculoskeletal: Negative.   Skin: Negative.   Neurological: Negative.   Hematological: Negative.   Psychiatric/Behavioral: Negative.        Objective:   Physical Exam  Constitutional: She is oriented to person, place, and time. She appears well-developed and well-nourished. No distress.  HENT:  Head: Normocephalic and atraumatic.  Right Ear: External ear normal.  Left Ear: External ear normal.  Nose: Nose normal.  Mouth/Throat: Oropharynx is clear and moist. No oropharyngeal exudate.  Eyes: Conjunctivae and EOM are normal. Pupils are equal, round, and reactive to light. No scleral icterus.  Neck: Normal range of motion. Neck supple. No JVD present. No thyromegaly present.  Cardiovascular: Normal rate, regular rhythm, normal heart sounds and intact distal pulses.  Exam reveals no gallop and no friction rub.   No murmur heard.      EKG normal  Pulmonary/Chest: Effort normal and breath sounds normal. No respiratory distress. She has no wheezes. She has no rales. She  exhibits no tenderness.  Abdominal: Soft. Bowel sounds are normal. She exhibits no distension and no mass. There is no tenderness. There is no rebound and no guarding.  Musculoskeletal: Normal range of motion. She exhibits no edema and no tenderness.  Lymphadenopathy:    She has no cervical adenopathy.  Neurological: She is alert and oriented to person, place, and time. She has normal reflexes. No cranial nerve deficit. She exhibits normal muscle tone. Coordination normal.  Skin: Skin is warm and dry. No rash noted. No erythema.  Psychiatric: She has a normal mood and affect. Her behavior is normal. Judgment and thought content normal.          Assessment & Plan:  Treat the UTI with Cipro. Get fasting labs today. Given a Toradol shot since this always helps her. Refer to Dr. Cyndia Diver pain clinic.

## 2011-05-25 ENCOUNTER — Telehealth: Payer: Self-pay | Admitting: Family Medicine

## 2011-05-25 NOTE — Telephone Encounter (Signed)
Message copied by Baldemar Friday on Thu May 25, 2011  4:19 PM ------      Message from: Gershon Crane A      Created: Thu May 25, 2011 12:38 PM       Normal except her urine shows a UTI (which we are already treating her for), and her chol is a bit high. Watch the diet and drink water

## 2011-05-25 NOTE — Telephone Encounter (Signed)
Spoke with daughter and gave results.

## 2011-05-29 ENCOUNTER — Other Ambulatory Visit (HOSPITAL_COMMUNITY): Payer: Medicare Other | Admitting: Radiology

## 2011-06-04 ENCOUNTER — Emergency Department (HOSPITAL_COMMUNITY)
Admission: EM | Admit: 2011-06-04 | Discharge: 2011-06-04 | Disposition: A | Discharge status: Home / Self Care | Payer: Medicare Other | Attending: Emergency Medicine | Admitting: Emergency Medicine

## 2011-06-04 ENCOUNTER — Emergency Department (HOSPITAL_COMMUNITY): Payer: Medicare Other

## 2011-06-04 DIAGNOSIS — G589 Mononeuropathy, unspecified: Secondary | ICD-10-CM | POA: Insufficient documentation

## 2011-06-04 DIAGNOSIS — M79609 Pain in unspecified limb: Secondary | ICD-10-CM | POA: Insufficient documentation

## 2011-06-04 DIAGNOSIS — S9030XA Contusion of unspecified foot, initial encounter: Secondary | ICD-10-CM | POA: Insufficient documentation

## 2011-06-04 DIAGNOSIS — Y92009 Unspecified place in unspecified non-institutional (private) residence as the place of occurrence of the external cause: Secondary | ICD-10-CM | POA: Insufficient documentation

## 2011-06-04 DIAGNOSIS — W050XXA Fall from non-moving wheelchair, initial encounter: Secondary | ICD-10-CM | POA: Insufficient documentation

## 2011-06-19 ENCOUNTER — Telehealth: Payer: Self-pay | Admitting: Family Medicine

## 2011-06-19 NOTE — Telephone Encounter (Signed)
Refill Oxycodone. Thanks. °

## 2011-06-21 NOTE — Telephone Encounter (Signed)
Pt last seen 8.22.12. Pls advise.

## 2011-06-21 NOTE — Telephone Encounter (Signed)
Pt called again about refill. Pt is completely out of meds. Please contact when ready

## 2011-06-22 MED ORDER — OXYCODONE HCL 10 MG PO TABS: 10.0000 mg | ORAL_TABLET | Freq: Four times a day (QID) | ORAL | Status: DC | PRN

## 2011-06-22 NOTE — Telephone Encounter (Signed)
done

## 2011-06-22 NOTE — Telephone Encounter (Signed)
Script is ready for pick up and LVM. 

## 2011-06-22 NOTE — Telephone Encounter (Signed)
Pts daughter called to check on status of oxycodone. Pts daughter wants to pick up script today.

## 2011-06-28 ENCOUNTER — Telehealth: Payer: Self-pay | Admitting: Family Medicine

## 2011-06-28 NOTE — Telephone Encounter (Signed)
Refill request for Dilaudid 8 mg take 1 po tid prn. Pt last here on 05/24/11 and script last filled on 05/24/11.

## 2011-06-28 NOTE — Telephone Encounter (Signed)
Pt requesting refill on HYDROmorphone (DILAUDID) 8 MG tablet Need by Friday. Please contact pt when ready to pick up

## 2011-06-30 MED ORDER — HYDROMORPHONE HCL 8 MG PO TABS: 8.0000 mg | ORAL_TABLET | Freq: Three times a day (TID) | ORAL | Status: DC | PRN

## 2011-06-30 NOTE — Telephone Encounter (Signed)
done

## 2011-06-30 NOTE — Telephone Encounter (Signed)
Script is ready for pick up and left voice message for pt. 

## 2011-07-10 ENCOUNTER — Telehealth: Payer: Self-pay | Admitting: Family Medicine

## 2011-07-10 NOTE — Telephone Encounter (Signed)
Refill request for Clorazepate 7.5 mg take 1 po qid. Pt last here on 05/24/11 and script last filled on 06/06/11.

## 2011-07-11 ENCOUNTER — Telehealth: Payer: Self-pay | Admitting: Family Medicine

## 2011-07-11 NOTE — Telephone Encounter (Signed)
NO refills. She was switched from Chlorazepate to Clonazepam some time ago. She cannot take both of these. She got a 6 month supply of Clonazepam in August

## 2011-07-12 NOTE — Telephone Encounter (Signed)
Open in error

## 2011-07-13 NOTE — Telephone Encounter (Signed)
Left voice message.

## 2011-07-17 ENCOUNTER — Telehealth: Payer: Self-pay | Admitting: Family Medicine

## 2011-07-17 NOTE — Telephone Encounter (Signed)
Refill Oxycodone. Thanks. °

## 2011-07-18 MED ORDER — OXYCODONE HCL 10 MG PO TABS: 10.0000 mg | ORAL_TABLET | Freq: Four times a day (QID) | ORAL | Status: DC | PRN

## 2011-07-18 NOTE — Telephone Encounter (Signed)
done

## 2011-07-18 NOTE — Telephone Encounter (Signed)
Script is ready for pick up and left voice message for pt. 

## 2011-07-31 ENCOUNTER — Telehealth: Payer: Self-pay | Admitting: Family Medicine

## 2011-07-31 NOTE — Telephone Encounter (Signed)
Refill hydromorphone and Clorazepate. Thanks.

## 2011-08-01 ENCOUNTER — Telehealth: Payer: Self-pay | Admitting: Family Medicine

## 2011-08-01 NOTE — Telephone Encounter (Signed)
Refill request for Clonazepate 7.5 mg take 1 po qd.

## 2011-08-02 MED ORDER — HYDROMORPHONE HCL 8 MG PO TABS: 8.0000 mg | ORAL_TABLET | Freq: Three times a day (TID) | ORAL | Status: DC | PRN

## 2011-08-02 NOTE — Telephone Encounter (Signed)
At our last visit we agreed to stop the Chlorazepate because she should not be taking this and Clonazepam at the same time. The Dilaudid was refilled

## 2011-08-02 NOTE — Telephone Encounter (Signed)
Pt aware.

## 2011-08-02 NOTE — Telephone Encounter (Signed)
See my note from yesterday.

## 2011-08-02 NOTE — Telephone Encounter (Signed)
The one script is ready for pick up and left message for pt.

## 2011-08-14 ENCOUNTER — Telehealth: Payer: Self-pay | Admitting: Family Medicine

## 2011-08-14 NOTE — Telephone Encounter (Signed)
Requesting refill on Oxycodone HCl 10 MG TABS  Please contact when ready to pick up

## 2011-08-15 MED ORDER — OXYCODONE HCL 10 MG PO TABS: 10.0000 mg | ORAL_TABLET | Freq: Four times a day (QID) | ORAL | Status: DC | PRN

## 2011-08-15 NOTE — Telephone Encounter (Signed)
Script is ready for pick up and left voice message for pt. 

## 2011-08-15 NOTE — Telephone Encounter (Signed)
done

## 2011-08-29 ENCOUNTER — Telehealth: Payer: Self-pay | Admitting: Family Medicine

## 2011-08-29 NOTE — Telephone Encounter (Signed)
Refill Hydromorphone and Clorazepate.

## 2011-08-30 MED ORDER — HYDROMORPHONE HCL 8 MG PO TABS: 8.0000 mg | ORAL_TABLET | Freq: Three times a day (TID) | ORAL | Status: DC | PRN

## 2011-08-30 NOTE — Telephone Encounter (Signed)
Scripts ready for pick up and pt aware. 

## 2011-08-30 NOTE — Telephone Encounter (Signed)
The Dilaudid was refilled. They need to stop asking for Clorazepate because we stopped this a long time ago. They keep asking for this and I don't know why

## 2011-09-13 ENCOUNTER — Other Ambulatory Visit: Payer: Self-pay | Admitting: Family Medicine

## 2011-09-13 NOTE — Telephone Encounter (Signed)
Pt need refill on tizanidine 4 mg call into [facility] also new rx oxycodone 10mg 

## 2011-09-14 MED ORDER — OXYCODONE HCL 10 MG PO TABS: 10.0000 mg | ORAL_TABLET | Freq: Four times a day (QID) | ORAL | Status: DC | PRN

## 2011-09-14 MED ORDER — TIZANIDINE HCL 4 MG PO TABS: 4.0000 mg | ORAL_TABLET | Freq: Four times a day (QID) | ORAL | Status: DC | PRN

## 2011-09-14 NOTE — Telephone Encounter (Signed)
One script is ready for pick up and left voice message for pt. I still need to call the other script in.

## 2011-09-14 NOTE — Telephone Encounter (Signed)
Script called in

## 2011-09-14 NOTE — Telephone Encounter (Signed)
Oxycodone is done. Please call in Tizanidine #120 with 5 rf

## 2011-09-28 ENCOUNTER — Other Ambulatory Visit: Payer: Self-pay | Admitting: Family Medicine

## 2011-09-28 MED ORDER — HYDROMORPHONE HCL 8 MG PO TABS: 8.0000 mg | ORAL_TABLET | Freq: Three times a day (TID) | ORAL | Status: DC | PRN

## 2011-09-28 NOTE — Telephone Encounter (Signed)
Script needs to be signed, then pt can pick up. I left a voice message.

## 2011-09-28 NOTE — Telephone Encounter (Signed)
Dr fry is out of the office  Unsure why she is on 2 narcotic pain meds  Can refill  30 #  And have pcp do future meds

## 2011-09-28 NOTE — Telephone Encounter (Signed)
Pt need new rx hydromorphone 8 mg. Pt is due this weekend. Pt daughter is aware MD out of office this week.

## 2011-10-02 ENCOUNTER — Telehealth: Payer: Self-pay | Admitting: Family Medicine

## 2011-10-02 MED ORDER — HYDROMORPHONE HCL 8 MG PO TABS: 8.0000 mg | ORAL_TABLET | Freq: Three times a day (TID) | ORAL | Status: DC | PRN

## 2011-10-02 NOTE — Telephone Encounter (Signed)
Pt is requesting a new order for Dilaudid 8 mg take 1 po tid prn, a partial was approved by Dr. Fabian Sharp and ready for pick. I will discard the previous script.

## 2011-10-02 NOTE — Telephone Encounter (Signed)
Script ready for pick up and pt aware. 

## 2011-10-02 NOTE — Telephone Encounter (Signed)
done

## 2011-10-02 NOTE — Telephone Encounter (Signed)
Daughter called again. Needs today. Going out of town.

## 2011-10-16 ENCOUNTER — Telehealth: Payer: Self-pay | Admitting: Family Medicine

## 2011-10-16 MED ORDER — OXYCODONE HCL 10 MG PO TABS: 10.0000 mg | ORAL_TABLET | Freq: Four times a day (QID) | ORAL | Status: DC | PRN

## 2011-10-16 NOTE — Telephone Encounter (Signed)
Given to her daughter 

## 2011-10-30 ENCOUNTER — Telehealth: Payer: Self-pay | Admitting: Family Medicine

## 2011-10-30 MED ORDER — HYDROMORPHONE HCL 8 MG PO TABS: 8.0000 mg | ORAL_TABLET | Freq: Three times a day (TID) | ORAL | Status: DC | PRN

## 2011-10-30 NOTE — Telephone Encounter (Signed)
done

## 2011-10-30 NOTE — Telephone Encounter (Signed)
Pt needs new script for HYDROmorphone (DILAUDID) 8 MG

## 2011-10-30 NOTE — Telephone Encounter (Signed)
Script ready for pick up and left voice message for pt. 

## 2011-11-14 ENCOUNTER — Other Ambulatory Visit: Payer: Self-pay | Admitting: Family Medicine

## 2011-11-14 MED ORDER — OXYCODONE HCL 10 MG PO TABS: 10.0000 mg | ORAL_TABLET | Freq: Four times a day (QID) | ORAL | Status: DC | PRN

## 2011-11-14 NOTE — Telephone Encounter (Signed)
done

## 2011-11-14 NOTE — Telephone Encounter (Signed)
Script is ready for pick up and left voice message for pt. 

## 2011-11-14 NOTE — Telephone Encounter (Signed)
Pt needs new rx oxycodone 10 mg °

## 2011-11-24 ENCOUNTER — Other Ambulatory Visit: Payer: Self-pay | Admitting: Family Medicine

## 2011-11-28 ENCOUNTER — Telehealth: Payer: Self-pay | Admitting: Family Medicine

## 2011-11-28 MED ORDER — HYDROMORPHONE HCL 8 MG PO TABS: 8.0000 mg | ORAL_TABLET | Freq: Three times a day (TID) | ORAL | Status: DC | PRN

## 2011-11-28 NOTE — Telephone Encounter (Signed)
Script is ready for pick up and left message for pt. 

## 2011-11-28 NOTE — Telephone Encounter (Signed)
done

## 2011-11-28 NOTE — Telephone Encounter (Signed)
Pt needs refill of HYDROmorphone (DILAUDID) 8 MG tablet . Pls notify daughter when this is ready for pick up.

## 2011-12-11 ENCOUNTER — Telehealth: Payer: Self-pay

## 2011-12-11 NOTE — Telephone Encounter (Signed)
Pt's daughter is in office requesting refill for pt's oxycodone 10 mg.

## 2011-12-12 MED ORDER — OXYCODONE HCL 10 MG PO TABS: 10.0000 mg | ORAL_TABLET | Freq: Four times a day (QID) | ORAL | Status: DC | PRN

## 2011-12-12 NOTE — Telephone Encounter (Signed)
rx up front ready for p/u, pt aware 

## 2011-12-12 NOTE — Telephone Encounter (Signed)
Done

## 2011-12-25 ENCOUNTER — Telehealth: Payer: Self-pay | Admitting: Family Medicine

## 2011-12-25 NOTE — Telephone Encounter (Signed)
Patient's daughter called stating that the patient need a refill on her hydromorphone. Please assist.

## 2011-12-26 ENCOUNTER — Telehealth: Payer: Self-pay | Admitting: Family Medicine

## 2011-12-26 MED ORDER — HYDROMORPHONE HCL 8 MG PO TABS: 8.0000 mg | ORAL_TABLET | Freq: Three times a day (TID) | ORAL | Status: DC | PRN

## 2011-12-26 NOTE — Telephone Encounter (Signed)
Script ready for pick up and left voice message for pt. 

## 2011-12-26 NOTE — Telephone Encounter (Signed)
done

## 2011-12-26 NOTE — Telephone Encounter (Signed)
Call in #120 with 5 rf 

## 2011-12-26 NOTE — Telephone Encounter (Signed)
Refill request for Clonazepam 0.5 mg take 1/2 tablet qid prn and pt last here on 05/24/11.

## 2011-12-27 IMAGING — CT CT HEAD W/O CM
2 series · 16 of 30 positions shown, 18 images · non-contrast
Comparison: None.

CLINICAL DATA: 68-year-old female with fall, altered mental status,
fever.

CT HEAD WITHOUT CONTRAST
TECHNIQUE: Contiguous axial images were obtained from the base of
the skull through the vertex without contrast.

[Series 3: head w/o · axial · non-contrast · 0.42mm/px · z∈[+93,+203]mm · 8 of 30 slices shown, 10 images]
[im 4/30  brain]
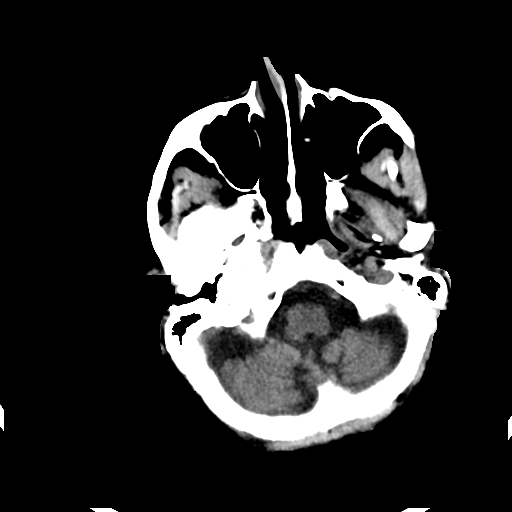
[im 4/30  bone]
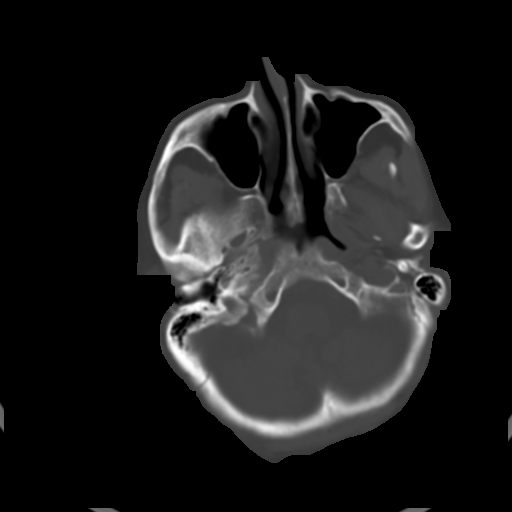
[im 7/30  brain]
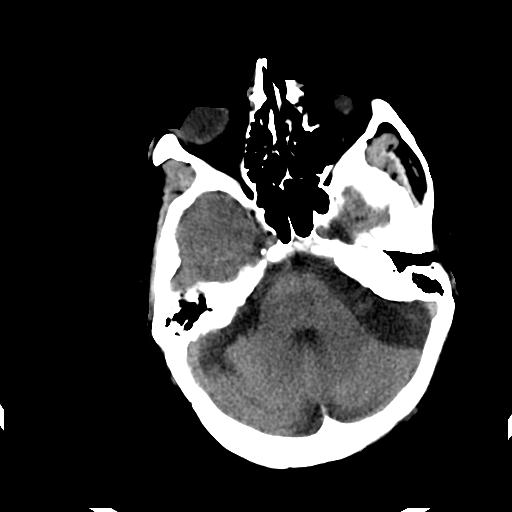
[im 10/30  brain]
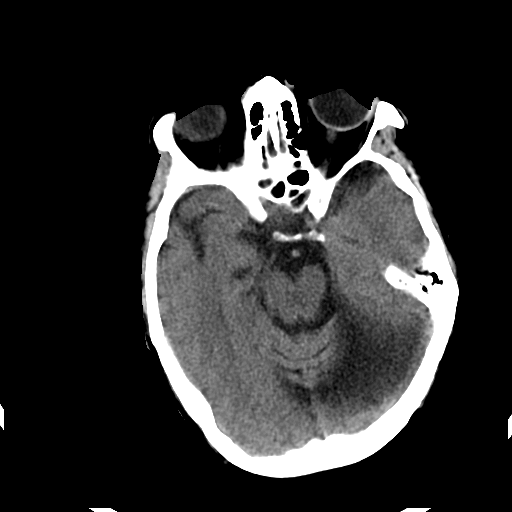
[im 13/30  brain]
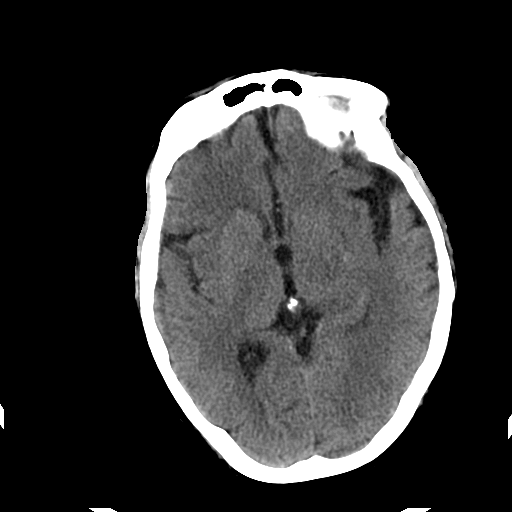
[im 17/30  brain]
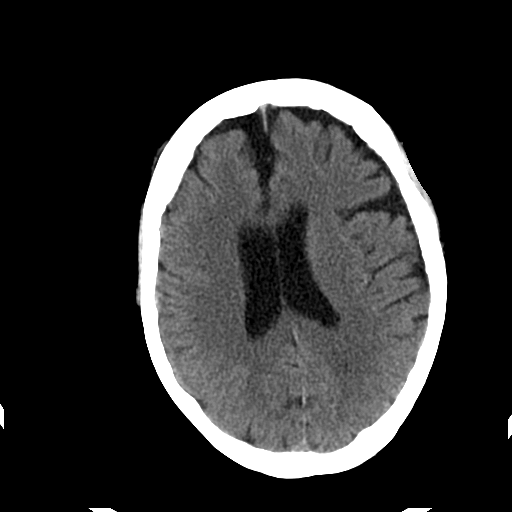
[im 17/30  bone]
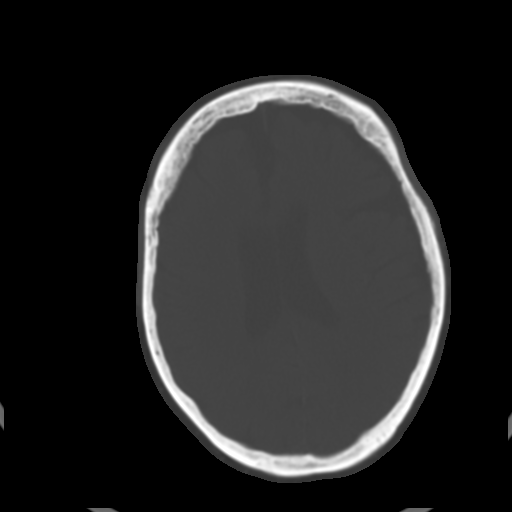
[im 20/30  brain]
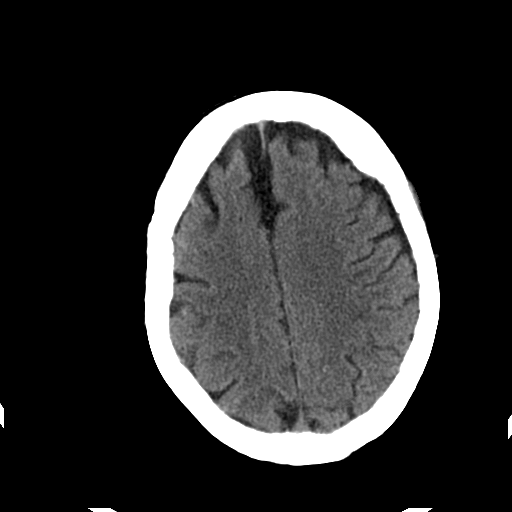
[im 23/30  brain]
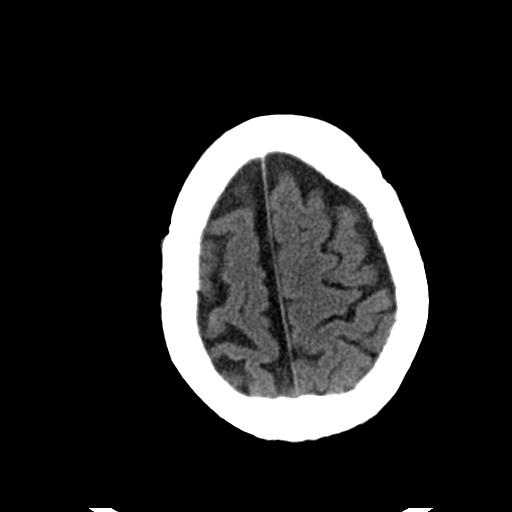
[im 26/30  brain]
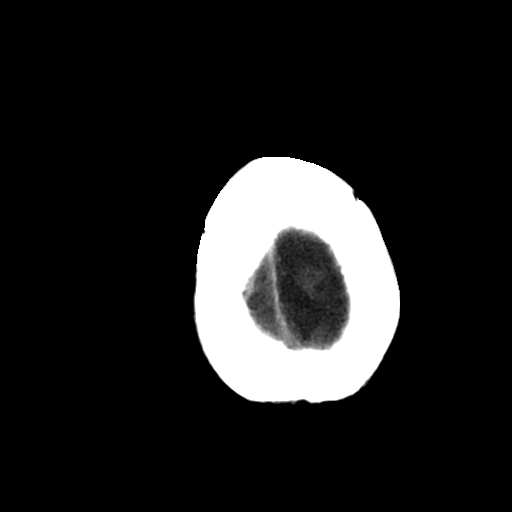

[Series 4: head w/o bone · axial · non-contrast · 0.42mm/px · z∈[+93,+205]mm · 8 of 59 slices shown]
[im 7/59  bone]
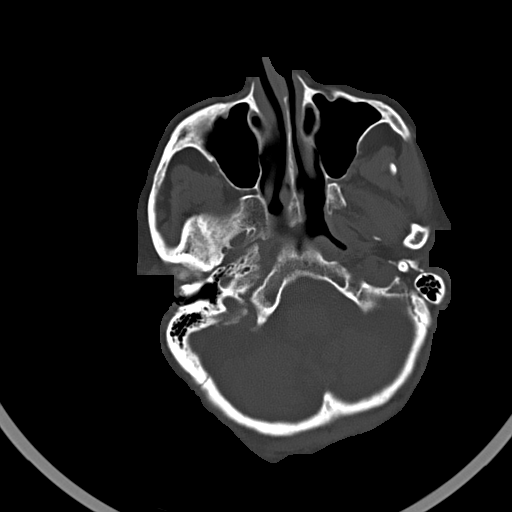
[im 13/59  bone]
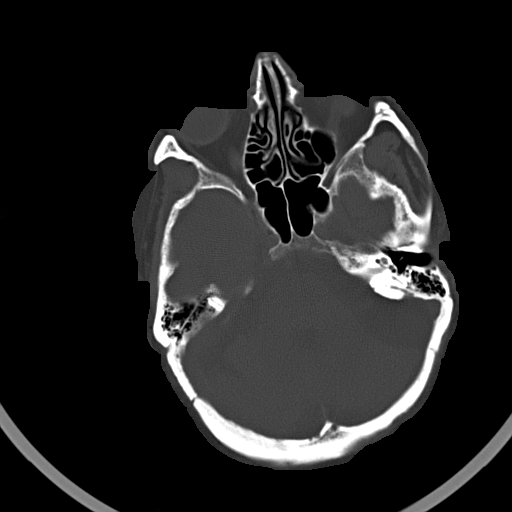
[im 19/59  bone]
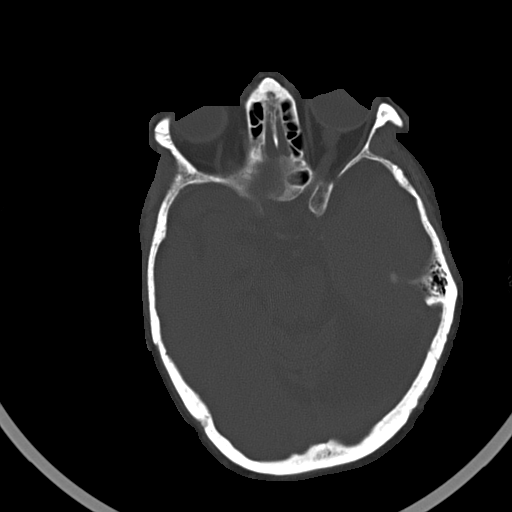
[im 25/59  bone]
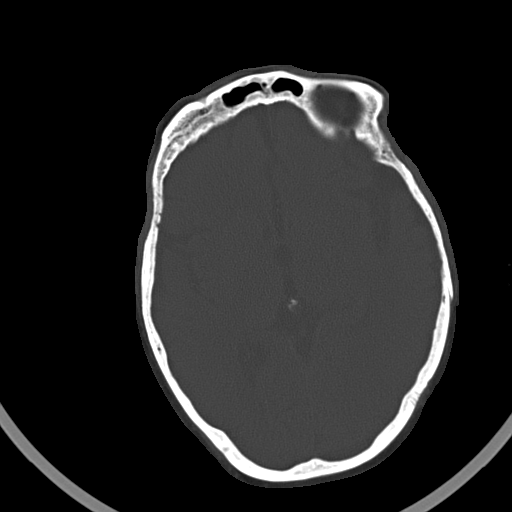
[im 34/59  bone]
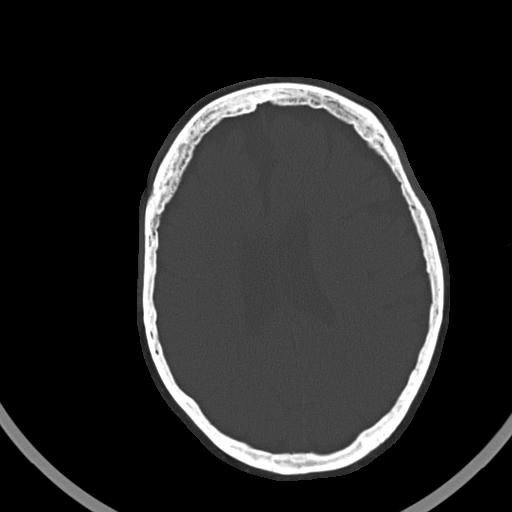
[im 40/59  bone]
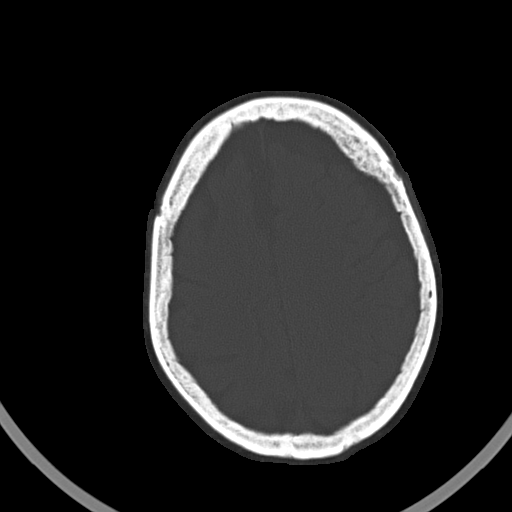
[im 46/59  bone]
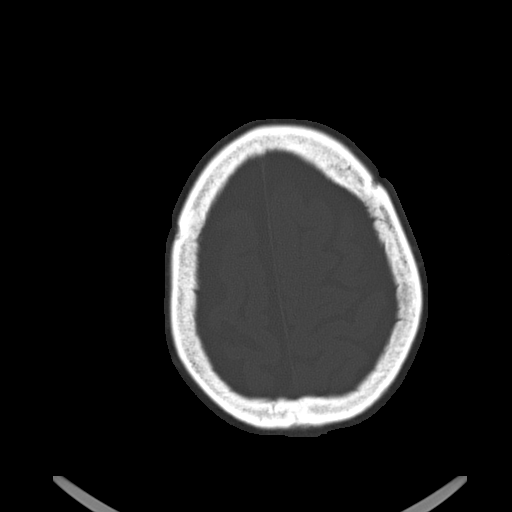
[im 52/59  bone]
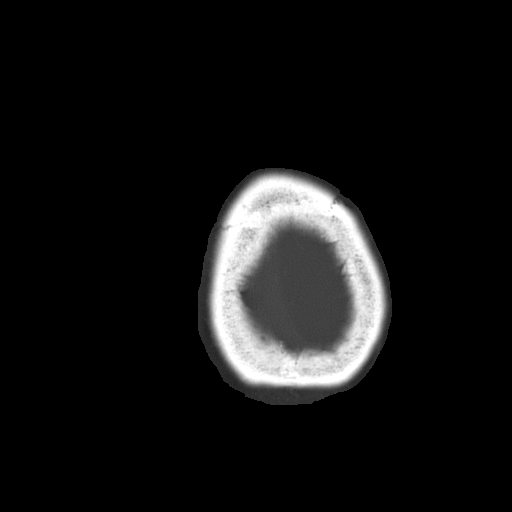

[16 of 30 positions shown; findings below may reference images not displayed]

FINDINGS: Visualized orbits and scalp soft tissues are within
normal limits.  Visualized paranasal sinuses and mastoids are
clear.  No acute osseous abnormality identified.

Extra-axial CSF attenuation in the posterior fossa, and to a lesser
extent the supratentorial compartment is felt related to volume
loss/atrophy. No acute intracranial hemorrhage identified.  No
midline shift, mass effect, or evidence of mass lesion.  No
ventriculomegaly.  Partially empty sella also suspected. No
evidence of cortically based acute infarction identified.  No
suspicious intracranial vascular hyperdensity.
IMPRESSION: Negative noncontrast CT appearance of the brain for age; extra-
axial CSF in the posterior fossa and, to a lesser extent, the
supratentorial compartment is thought related to volume loss.

## 2011-12-27 MED ORDER — CLONAZEPAM 0.5 MG PO TABS: ORAL_TABLET | ORAL | Status: DC

## 2011-12-27 NOTE — Telephone Encounter (Signed)
Rx called in to pharmacy. 

## 2011-12-28 IMAGING — US US ABDOMEN COMPLETE
1 series · 14 of 25 positions shown · non-contrast
Comparison: None.

CLINICAL DATA: Elevated LFTs

ABDOMEN ULTRASOUND
TECHNIQUE: Complete abdominal ultrasound examination was performed
including evaluation of the liver, gallbladder, bile ducts,
pancreas, kidneys, spleen, IVC, and abdominal aorta.

[Series 1: us abdomen complete · 0.31mm/px · 14 of 60 slices shown]
[im 1/60]
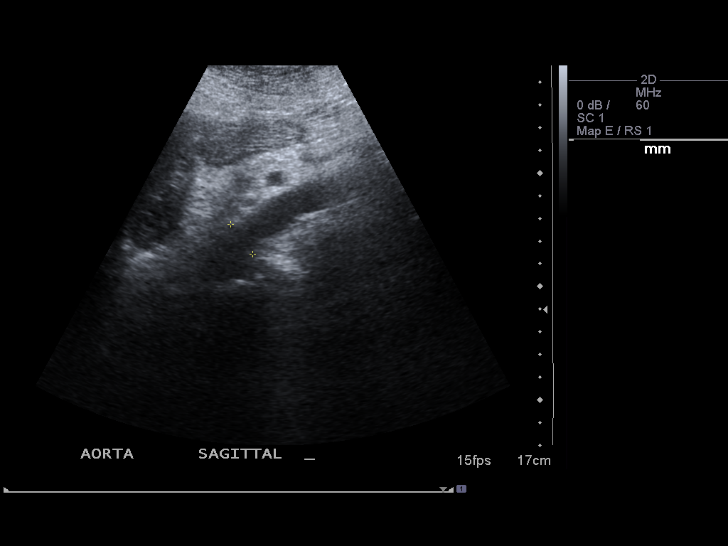
[im 5/60]
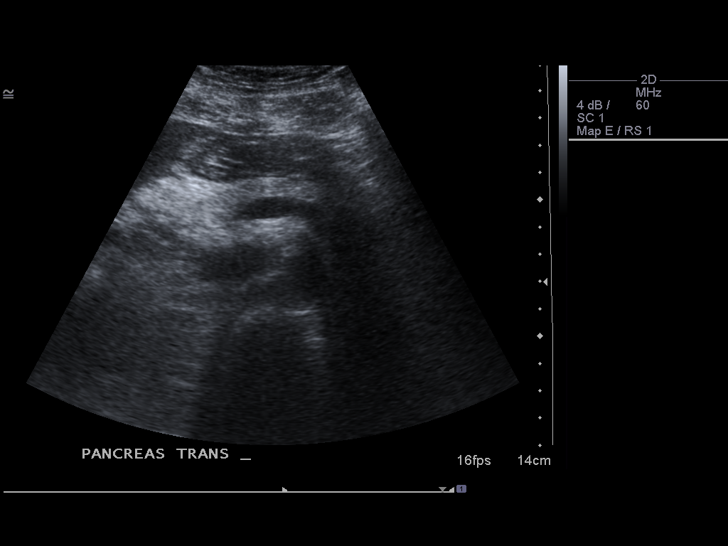
[im 10/60]
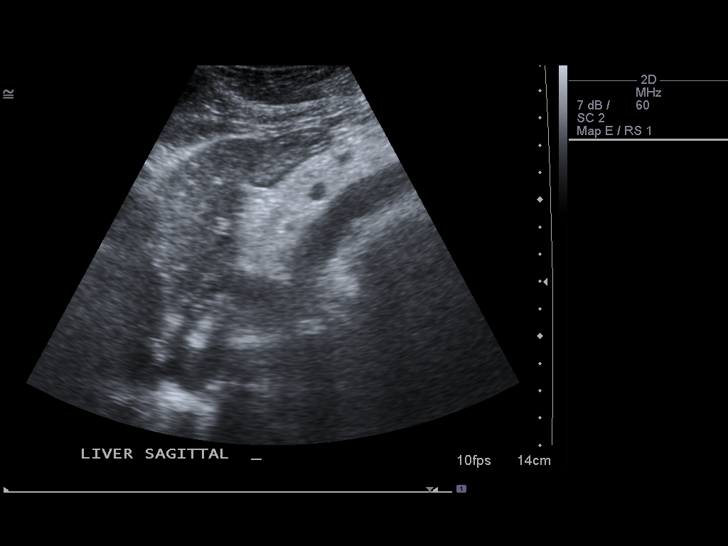
[im 15/60]
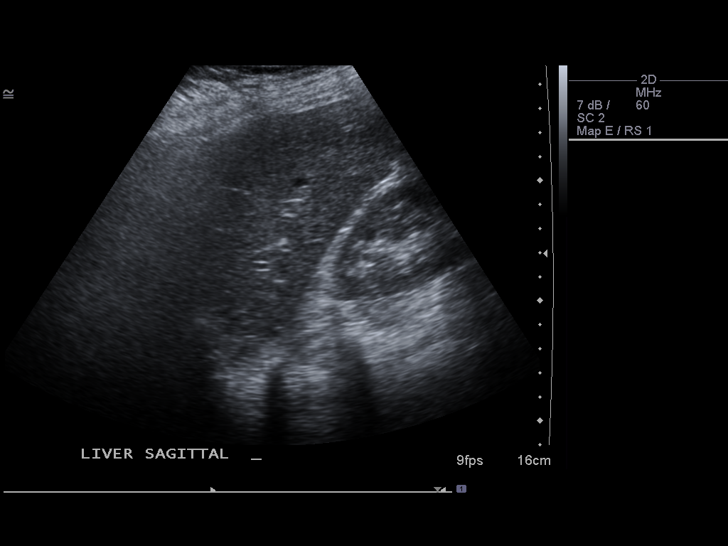
[im 20/60]
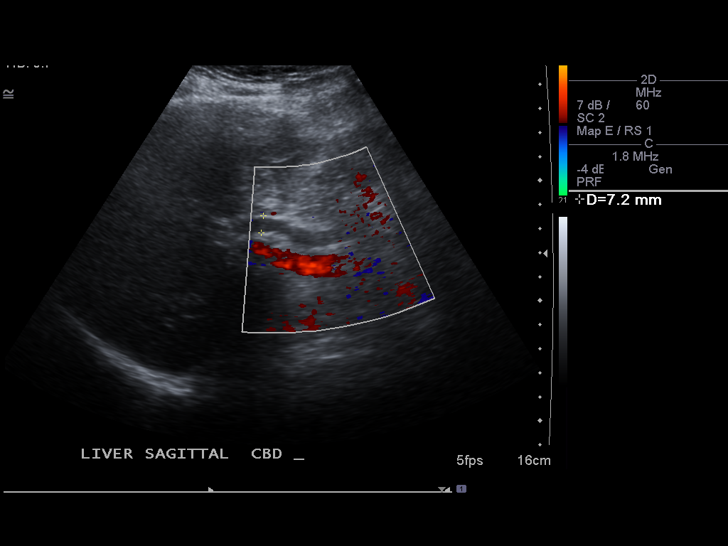
[im 23/60]
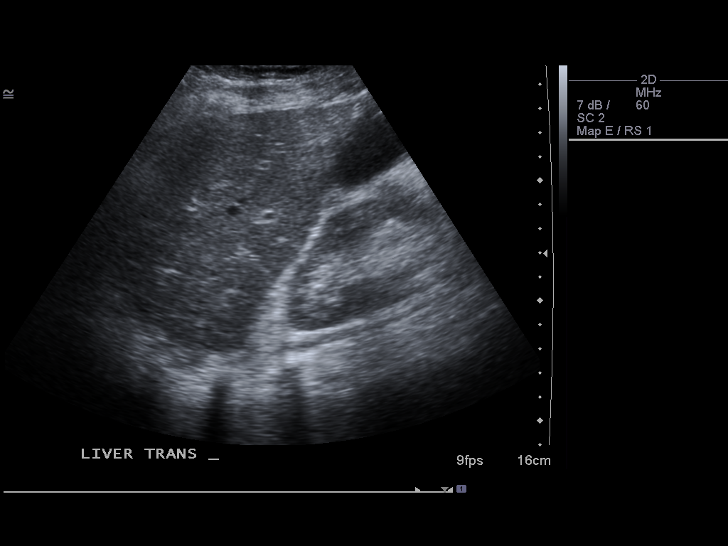
[im 28/60]
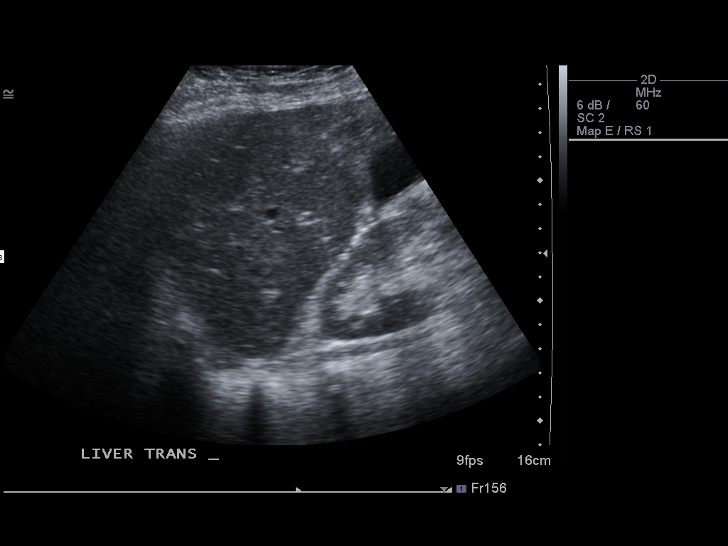
[im 32/60]
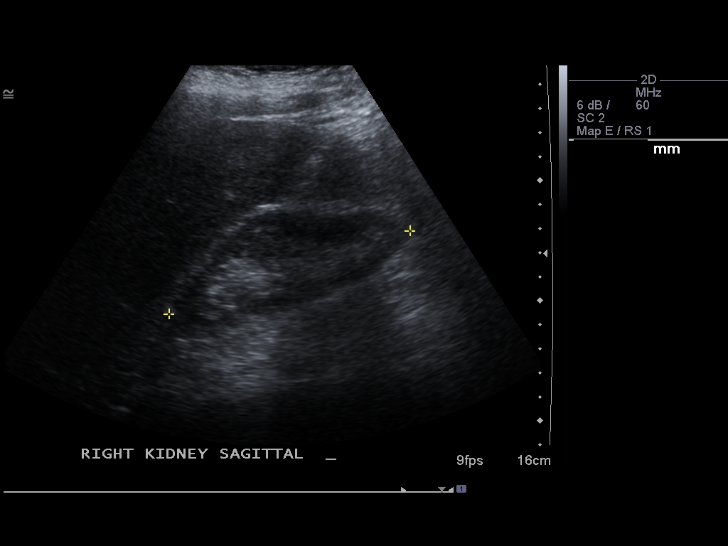
[im 37/60]
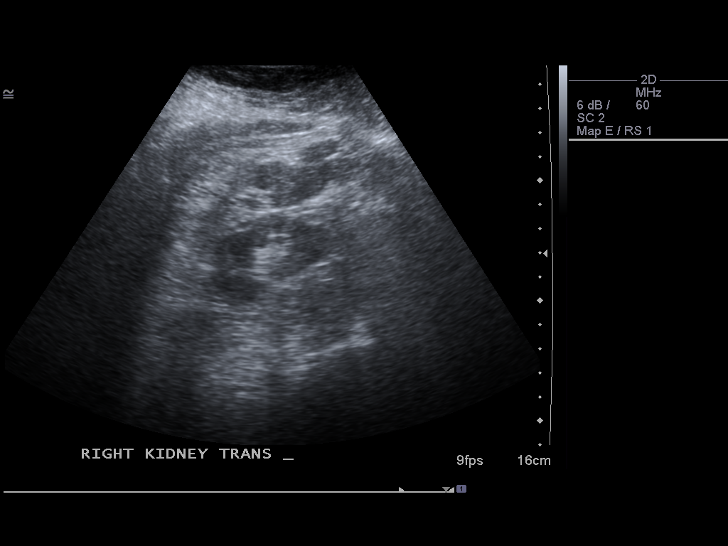
[im 40/60]
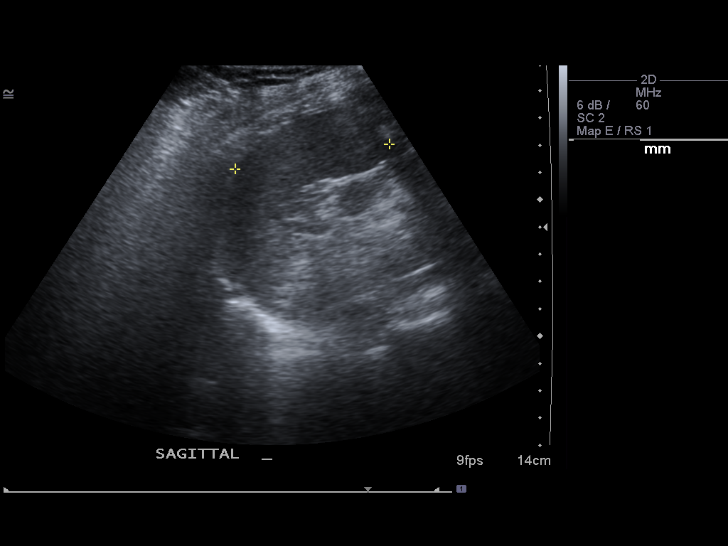
[im 45/60]
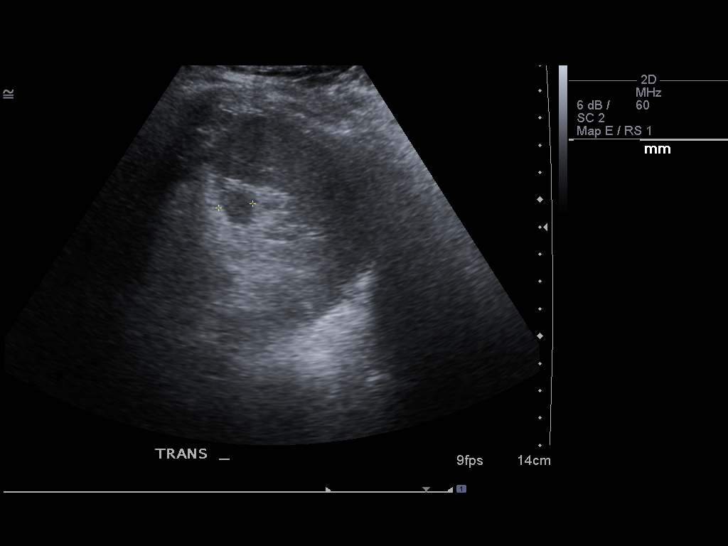
[im 50/60]
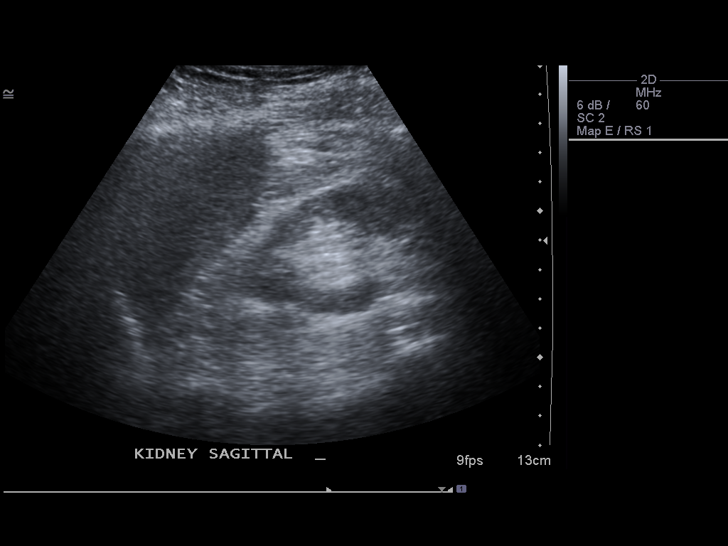
[im 55/60]
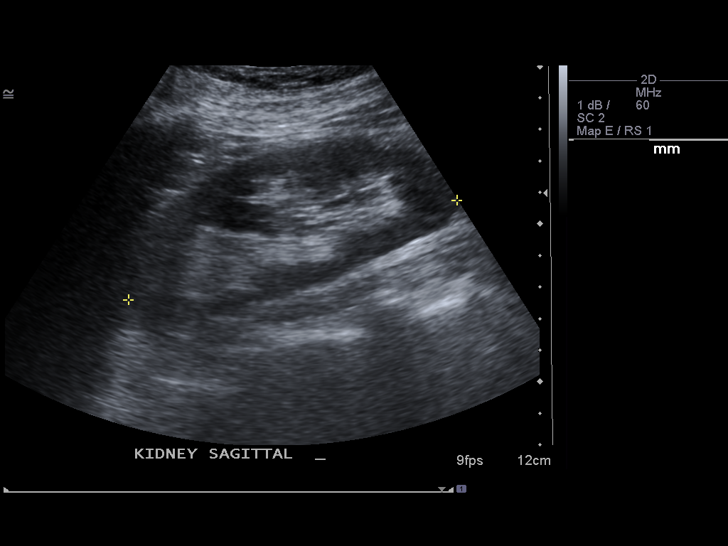
[im 60/60]
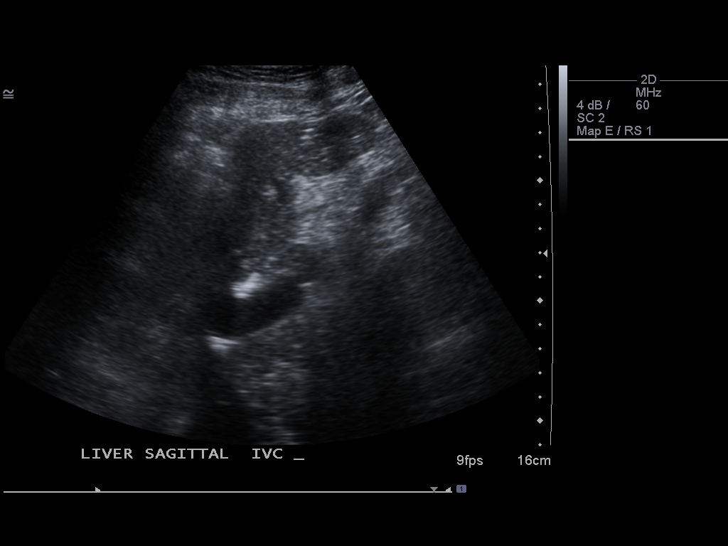

[14 of 25 positions shown; findings below may reference images not displayed]

FINDINGS: Gallbladder:  Surgically absent

Common Bile Duct:  Nondilated for age at 7 mm diameter.

Liver:  Normal.  No focal parenchymal abnormality.  No biliary
dilation.

IVC:  Normal.

Pancreas:  Pancreatic tail obscured by overlying bowel gas.

Spleen:  Normal.

Right kidney:  10.6 cm in long axis.  Normal.

Left kidney:  10.9 cm in long axis.  Normal.

Abdominal Aorta:  No aneurysm.
IMPRESSION: Unremarkable exam.  No findings to explain the patient's history of
elevated LFTs.

## 2012-01-10 ENCOUNTER — Telehealth: Payer: Self-pay | Admitting: Family Medicine

## 2012-01-10 NOTE — Telephone Encounter (Signed)
Patient's daughter called stating that she need a refill on her oxycodone 10. Please assist.

## 2012-01-11 MED ORDER — OXYCODONE HCL 10 MG PO TABS: 10.0000 mg | ORAL_TABLET | Freq: Four times a day (QID) | ORAL | Status: DC | PRN

## 2012-01-11 NOTE — Telephone Encounter (Signed)
Script is ready for pick up and left voice message for pt. 

## 2012-01-11 NOTE — Telephone Encounter (Signed)
done

## 2012-01-22 ENCOUNTER — Telehealth: Payer: Self-pay | Admitting: Family Medicine

## 2012-01-22 NOTE — Telephone Encounter (Signed)
Pt needs refill of HYDROmorphone (DILAUDID) 8 MG tablet  

## 2012-01-23 MED ORDER — HYDROMORPHONE HCL 8 MG PO TABS: 8.0000 mg | ORAL_TABLET | Freq: Three times a day (TID) | ORAL | Status: DC | PRN

## 2012-01-23 NOTE — Telephone Encounter (Signed)
rx up front ready for p/u, pt aware 

## 2012-01-23 NOTE — Telephone Encounter (Signed)
done

## 2012-02-07 ENCOUNTER — Other Ambulatory Visit: Payer: Self-pay | Admitting: Family Medicine

## 2012-02-07 NOTE — Telephone Encounter (Signed)
Pt needs new rx oxycodone 10 mg °

## 2012-02-09 MED ORDER — OXYCODONE HCL 10 MG PO TABS: 10.0000 mg | ORAL_TABLET | Freq: Four times a day (QID) | ORAL | Status: DC | PRN

## 2012-02-09 NOTE — Telephone Encounter (Signed)
done

## 2012-02-09 NOTE — Telephone Encounter (Signed)
Script is ready and left voice message for pt. 

## 2012-02-20 ENCOUNTER — Telehealth: Payer: Self-pay | Admitting: Family Medicine

## 2012-02-20 NOTE — Telephone Encounter (Signed)
Pt needs refill of Hydromorphone 8mg .

## 2012-02-21 MED ORDER — HYDROMORPHONE HCL 8 MG PO TABS: 8.0000 mg | ORAL_TABLET | Freq: Three times a day (TID) | ORAL | Status: DC | PRN

## 2012-02-21 NOTE — Telephone Encounter (Signed)
done

## 2012-02-21 NOTE — Telephone Encounter (Signed)
Script is ready for pick up and left voice message for pt. 

## 2012-03-12 ENCOUNTER — Ambulatory Visit (INDEPENDENT_AMBULATORY_CARE_PROVIDER_SITE_OTHER): Payer: Medicare Other | Admitting: Family Medicine

## 2012-03-12 ENCOUNTER — Encounter: Payer: Self-pay | Admitting: Family Medicine

## 2012-03-12 VITALS — BP 124/80 | HR 99 | Temp 98.8°F

## 2012-03-12 DIAGNOSIS — G589 Mononeuropathy, unspecified: Secondary | ICD-10-CM

## 2012-03-12 DIAGNOSIS — L259 Unspecified contact dermatitis, unspecified cause: Secondary | ICD-10-CM

## 2012-03-12 DIAGNOSIS — L309 Dermatitis, unspecified: Secondary | ICD-10-CM

## 2012-03-12 DIAGNOSIS — H8309 Labyrinthitis, unspecified ear: Secondary | ICD-10-CM

## 2012-03-12 DIAGNOSIS — M545 Low back pain: Secondary | ICD-10-CM

## 2012-03-12 MED ORDER — OXYCODONE HCL 20 MG PO TABS: 20.0000 mg | ORAL_TABLET | Freq: Four times a day (QID) | ORAL | Status: DC | PRN

## 2012-03-12 MED ORDER — TIZANIDINE HCL 4 MG PO TABS: 4.0000 mg | ORAL_TABLET | Freq: Four times a day (QID) | ORAL | Status: DC | PRN

## 2012-03-12 MED ORDER — HYDROMORPHONE HCL 8 MG PO TABS: 8.0000 mg | ORAL_TABLET | Freq: Three times a day (TID) | ORAL | Status: DC | PRN

## 2012-03-12 MED ORDER — METHYLPREDNISOLONE ACETATE 80 MG/ML IJ SUSP
120.0000 mg | Freq: Once | INTRAMUSCULAR | Status: AC
  Administered 2012-03-12: 120 mg via INTRAMUSCULAR

## 2012-03-12 NOTE — Progress Notes (Signed)
Addended by: Aniceto Boss A on: 03/12/2012 03:50 PM   Modules accepted: Orders

## 2012-03-12 NOTE — Progress Notes (Signed)
  Subjective:    Patient ID: Karen Hardy, female    DOB: 05-31-1941, 71 y.o.   MRN: [id number]  HPI Here with her daughter for med refills and some other problems. She has been having more pain lately in her hips and back, and this has reduced her ability to exercise. This has contributed to some weight gain over the winter. Her pain meds no longer control the pain as well as before. Also she has had 2 weeks of intermittent pain in the left ear and spells of dizziness, which make the room seem to spin around her. This causes her to feel nauseated at times. No fever. She has has some mild ST also. Several family members have had viral illnesses lately. Lastly she has an itchy rash on the left hand.    Review of Systems  Constitutional: Negative.   HENT: Positive for ear pain, congestion, sore throat and postnasal drip. Negative for tinnitus.   Eyes: Negative.   Respiratory: Negative.   Musculoskeletal: Positive for back pain.  Neurological: Positive for dizziness.       Objective:   Physical Exam  Constitutional:       In her wheelchair, alert  HENT:  Head: Normocephalic and atraumatic.  Right Ear: External ear normal.  Left Ear: External ear normal.  Nose: Nose normal.  Mouth/Throat: Oropharynx is clear and moist. No oropharyngeal exudate.  Eyes: Conjunctivae are normal. Pupils are equal, round, and reactive to light.  Neck: Neck supple.  Pulmonary/Chest: Effort normal and breath sounds normal.  Lymphadenopathy:    She has no cervical adenopathy.          Assessment & Plan:  She has some eczema on the hand. Her daughter will use some Clobetasol cream she has at home on this. She has some vertigo from a viral URI. Drink fluids, use Benadryl prn. Given a steroid injection. As for the pain meds, we will increase the dose of Oxycodone to 20 mg q 6 hours.

## 2012-04-03 ENCOUNTER — Telehealth: Payer: Self-pay | Admitting: Family Medicine

## 2012-04-03 NOTE — Telephone Encounter (Signed)
Pts daughter called to req refill of pts Oxycodone HCl 20 MG TABS for pick up next week.

## 2012-04-08 MED ORDER — OXYCODONE HCL 20 MG PO TABS: 20.0000 mg | ORAL_TABLET | Freq: Four times a day (QID) | ORAL | Status: DC | PRN

## 2012-04-08 NOTE — Telephone Encounter (Signed)
done

## 2012-04-08 NOTE — Telephone Encounter (Signed)
Script is ready for pick up and I spoke with pt.  

## 2012-04-15 ENCOUNTER — Telehealth: Payer: Self-pay | Admitting: Family Medicine

## 2012-04-15 MED ORDER — HYDROMORPHONE HCL 8 MG PO TABS: 8.0000 mg | ORAL_TABLET | Freq: Three times a day (TID) | ORAL | Status: DC | PRN

## 2012-04-15 NOTE — Telephone Encounter (Signed)
Given to her daughter, Karen Hardy

## 2012-05-06 ENCOUNTER — Telehealth: Payer: Self-pay | Admitting: Family Medicine

## 2012-05-06 MED ORDER — OXYCODONE HCL 20 MG PO TABS: 20.0000 mg | ORAL_TABLET | Freq: Four times a day (QID) | ORAL | Status: DC | PRN

## 2012-05-06 NOTE — Telephone Encounter (Signed)
Script is ready for pick up and I left a voice message.  

## 2012-05-06 NOTE — Telephone Encounter (Signed)
done

## 2012-05-06 NOTE — Telephone Encounter (Signed)
Pt requesting refill on Oxycodone HCl 20 MG TABS ° °

## 2012-05-16 ENCOUNTER — Other Ambulatory Visit: Payer: Self-pay | Admitting: Family Medicine

## 2012-05-16 NOTE — Telephone Encounter (Signed)
Last seen 6/11/3 low back pain Last written 04/15/12 # 90  0RF Please advise

## 2012-05-16 NOTE — Telephone Encounter (Signed)
Pt needs new rx hydromophone 8mg 

## 2012-05-17 MED ORDER — HYDROMORPHONE HCL 8 MG PO TABS: 8.0000 mg | ORAL_TABLET | Freq: Three times a day (TID) | ORAL | Status: DC | PRN

## 2012-05-17 NOTE — Telephone Encounter (Signed)
Attempt to call [facility] - VM - LMTCB if questions - rx ready for pick up

## 2012-05-17 NOTE — Telephone Encounter (Signed)
done

## 2012-05-28 ENCOUNTER — Encounter: Payer: Self-pay | Admitting: Family Medicine

## 2012-05-28 ENCOUNTER — Ambulatory Visit (INDEPENDENT_AMBULATORY_CARE_PROVIDER_SITE_OTHER): Payer: Medicare Other | Admitting: Family Medicine

## 2012-05-28 ENCOUNTER — Telehealth: Payer: Self-pay | Admitting: Family Medicine

## 2012-05-28 VITALS — BP 118/70 | HR 92 | Temp 98.6°F

## 2012-05-28 DIAGNOSIS — I10 Essential (primary) hypertension: Secondary | ICD-10-CM

## 2012-05-28 DIAGNOSIS — E538 Deficiency of other specified B group vitamins: Secondary | ICD-10-CM

## 2012-05-28 LAB — CBC WITH DIFFERENTIAL/PLATELET
Basophils Absolute: 0 10*3/uL (ref 0.0–0.1)
Eosinophils Absolute: 0.2 10*3/uL (ref 0.0–0.7)
HCT: 33.2 % — ABNORMAL LOW (ref 36.0–46.0)
Hemoglobin: 10.5 g/dL — ABNORMAL LOW (ref 12.0–15.0)
Lymphs Abs: 3.1 10*3/uL (ref 0.7–4.0)
MCHC: 31.6 g/dL (ref 30.0–36.0)
MCV: 74.4 fl — ABNORMAL LOW (ref 78.0–100.0)
Monocytes Absolute: 0.6 10*3/uL (ref 0.1–1.0)
Monocytes Relative: 7.1 % (ref 3.0–12.0)
Neutro Abs: 4.3 10*3/uL (ref 1.4–7.7)
Platelets: 277 10*3/uL (ref 150.0–400.0)
RDW: 18.9 % — ABNORMAL HIGH (ref 11.5–14.6)

## 2012-05-28 LAB — BASIC METABOLIC PANEL
BUN: 22 mg/dL (ref 6–23)
Chloride: 107 mEq/L (ref 96–112)
Potassium: 3.9 mEq/L (ref 3.5–5.1)
Sodium: 142 mEq/L (ref 135–145)

## 2012-05-28 LAB — HEPATIC FUNCTION PANEL
ALT: 23 U/L (ref 0–35)
AST: 23 U/L (ref 0–37)
Alkaline Phosphatase: 76 U/L (ref 39–117)
Total Bilirubin: 0.5 mg/dL (ref 0.3–1.2)

## 2012-05-28 LAB — TSH: TSH: 1.6 u[IU]/mL (ref 0.35–5.50)

## 2012-05-28 LAB — VITAMIN B12: Vitamin B-12: 304 pg/mL (ref 211–911)

## 2012-05-28 MED ORDER — CLONAZEPAM 0.5 MG PO TABS: ORAL_TABLET | ORAL | Status: DC

## 2012-05-28 MED ORDER — OXYCODONE HCL 20 MG PO TABS: 20.0000 mg | ORAL_TABLET | Freq: Four times a day (QID) | ORAL | Status: DC | PRN

## 2012-05-28 MED ORDER — HYDROMORPHONE HCL 8 MG PO TABS: 8.0000 mg | ORAL_TABLET | Freq: Three times a day (TID) | ORAL | Status: DC | PRN

## 2012-05-28 MED ORDER — MAGIC MOUTHWASH W/LIDOCAINE: 5.0000 mL | Freq: Four times a day (QID) | ORAL | Status: AC | PRN

## 2012-05-28 MED ORDER — MAGIC MOUTHWASH: 5.0000 mL | ORAL | Status: DC | PRN

## 2012-05-28 NOTE — Addendum Note (Signed)
Addended by: Aniceto Boss A on: 05/28/2012 03:50 PM   Modules accepted: Orders

## 2012-05-28 NOTE — Telephone Encounter (Signed)
Sent!

## 2012-05-28 NOTE — Progress Notes (Signed)
  Subjective:    Patient ID: Karen Hardy, female    DOB: [birth year], 71 y.o.   MRN: [id number]  HPI Here with her daughter for several concerns. First she has been very anxious this past week because it is the anniversary of her husband's death. She has had more labile moods and has been in arguments with her daughter. At one point a week ago she threw all her meds in the toilet in anger. Now since she has been off the pain meds she seems to be withdrawing from them, and she has been shaky, confused, and hard to communicate with.    Review of Systems  Constitutional: Negative.   Respiratory: Negative.   Cardiovascular: Negative.   Gastrointestinal: Negative.   Neurological: Negative.   Psychiatric/Behavioral: Positive for behavioral problems, confusion, dysphoric mood and agitation. Negative for suicidal ideas, hallucinations and self-injury. The patient is nervous/anxious. The patient is not hyperactive.        Objective:   Physical Exam  Constitutional: She is oriented to person, place, and time. She appears well-developed and well-nourished.  Cardiovascular: Normal rate, regular rhythm, normal heart sounds and intact distal pulses.   Pulmonary/Chest: Effort normal and breath sounds normal.  Neurological: She is alert and oriented to person, place, and time.  Psychiatric: She has a normal mood and affect. Her behavior is normal. Judgment and thought content normal.          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 I refilled these today. We will temporarily increase the Clonazepam to a whole pill 4 times a day. Get labs

## 2012-05-28 NOTE — Progress Notes (Signed)
  Subjective:    Patient ID: Karen Hardy, female    DOB: [birth year], 71 y.o.   MRN: [id number]  HPI    Review of Systems     Objective:   Physical Exam        Assessment & Plan:  Call in Clonazepam #120 with 5 rf

## 2012-06-11 ENCOUNTER — Telehealth: Payer: Self-pay | Admitting: Family Medicine

## 2012-06-11 NOTE — Telephone Encounter (Signed)
She needs to talk to the pharmacy.  On 05-28-12 I gave them the correct new rx to take one whole tab 4 times a day ( it was for #120 a month)

## 2012-06-11 NOTE — Telephone Encounter (Signed)
Per Dr. Clent Ridges, change to 1 tablet qid and call in to pharmacy.

## 2012-06-11 NOTE — Telephone Encounter (Signed)
Patient's daughter called stating that the MD increased her mom's dosage of klonipin and the pharmacy will not fill it due to the MD not changing the rx with the increase. Patient has been without this 4 days. Please assist.

## 2012-06-13 MED ORDER — CLONAZEPAM 0.5 MG PO TABS: 0.5000 mg | ORAL_TABLET | Freq: Four times a day (QID) | ORAL | Status: DC | PRN

## 2012-06-13 NOTE — Telephone Encounter (Signed)
I spoke with pharmacy and the change was made.

## 2012-06-20 ENCOUNTER — Telehealth: Payer: Self-pay | Admitting: Family Medicine

## 2012-06-20 NOTE — Telephone Encounter (Signed)
Pt daughter called req refill for pts Oxycodone HCl 20 MG TABS.

## 2012-06-21 MED ORDER — OXYCODONE HCL 20 MG PO TABS: 20.0000 mg | ORAL_TABLET | Freq: Four times a day (QID) | ORAL | Status: DC | PRN

## 2012-06-21 NOTE — Telephone Encounter (Signed)
done

## 2012-06-21 NOTE — Telephone Encounter (Signed)
I spoke with pt and script is ready. 

## 2012-07-10 ENCOUNTER — Telehealth: Payer: Self-pay | Admitting: Family Medicine

## 2012-07-10 NOTE — Telephone Encounter (Signed)
Patient's daughter called stating that her mom need a refill of her hydromorphone. Please assist.

## 2012-07-11 MED ORDER — HYDROMORPHONE HCL 8 MG PO TABS: 8.0000 mg | ORAL_TABLET | Freq: Three times a day (TID) | ORAL | Status: DC | PRN

## 2012-07-11 NOTE — Telephone Encounter (Signed)
done

## 2012-07-11 NOTE — Telephone Encounter (Signed)
Script is ready for pick up and I spoke with pt.  

## 2012-07-12 ENCOUNTER — Telehealth: Payer: Self-pay | Admitting: Family Medicine

## 2012-07-12 NOTE — Telephone Encounter (Signed)
Pts daughter came by and said that her mom is going to be traveling tomorrow by plane and is going to need to get her HYDROmorphone (DILAUDID) 8 MG tablet filled today, instead of Monday. Pls re-do script to say "ok to fill".

## 2012-07-12 NOTE — Telephone Encounter (Signed)
This was taken care of

## 2012-07-18 ENCOUNTER — Telehealth: Payer: Self-pay | Admitting: Family Medicine

## 2012-07-18 NOTE — Telephone Encounter (Signed)
Pts daughter called and said that pt will need refill of Oxycodone HCl 20 MG TABS for pick up next week.

## 2012-07-19 MED ORDER — OXYCODONE HCL 20 MG PO TABS: 20.0000 mg | ORAL_TABLET | Freq: Four times a day (QID) | ORAL | Status: DC | PRN

## 2012-07-19 NOTE — Telephone Encounter (Signed)
Script is ready for pick up and I spoke with pt.  

## 2012-07-19 NOTE — Telephone Encounter (Signed)
done

## 2012-08-02 ENCOUNTER — Other Ambulatory Visit: Payer: Self-pay | Admitting: Family Medicine

## 2012-08-02 NOTE — Telephone Encounter (Signed)
Pt 's daughter called to request refill on hydromorphone 8mg .

## 2012-08-02 NOTE — Telephone Encounter (Signed)
No it is too soon. Not due until 08-11-12

## 2012-08-05 NOTE — Telephone Encounter (Signed)
I spoke with pt and she needs the script on 08/09/12, since the 10th falls on a weekend.

## 2012-08-06 MED ORDER — HYDROMORPHONE HCL 8 MG PO TABS
8.0000 mg | ORAL_TABLET | Freq: Three times a day (TID) | ORAL | Status: DC | PRN
Start: 2012-08-06 — End: 2012-09-04

## 2012-08-06 NOTE — Telephone Encounter (Signed)
Script is ready for pick up and I spoke with pt.  

## 2012-08-06 NOTE — Telephone Encounter (Signed)
done

## 2012-08-15 ENCOUNTER — Other Ambulatory Visit: Payer: Self-pay | Admitting: Family Medicine

## 2012-08-15 NOTE — Telephone Encounter (Signed)
Pt  needs refill script on oxycodone, 20 mg. Daughter will pick up Monday. Thank you.

## 2012-08-16 MED ORDER — OXYCODONE HCL 20 MG PO TABS: 20.0000 mg | ORAL_TABLET | Freq: Four times a day (QID) | ORAL | Status: DC | PRN

## 2012-08-16 NOTE — Telephone Encounter (Signed)
Script is ready and I spoke with pt. 

## 2012-08-16 NOTE — Telephone Encounter (Signed)
done

## 2012-08-29 ENCOUNTER — Other Ambulatory Visit: Payer: Self-pay | Admitting: Family Medicine

## 2012-09-04 ENCOUNTER — Telehealth: Payer: Self-pay | Admitting: Family Medicine

## 2012-09-04 MED ORDER — HYDROMORPHONE HCL 8 MG PO TABS: 8.0000 mg | ORAL_TABLET | Freq: Three times a day (TID) | ORAL | Status: DC | PRN

## 2012-09-04 NOTE — Telephone Encounter (Signed)
Pts daughter called req rx for pt HYDROmorphone (DILAUDID) 8 MG for pick up on Monday 09/09/12.

## 2012-09-04 NOTE — Telephone Encounter (Signed)
done

## 2012-09-04 NOTE — Telephone Encounter (Signed)
Script is ready for pick up and I spoke with pt.  

## 2012-09-12 ENCOUNTER — Other Ambulatory Visit: Payer: Self-pay | Admitting: Family Medicine

## 2012-09-12 NOTE — Telephone Encounter (Signed)
Pt needs new rx oxycodone. Daughter will pick up on monday

## 2012-09-13 MED ORDER — OXYCODONE HCL 20 MG PO TABS: 20.0000 mg | ORAL_TABLET | Freq: Four times a day (QID) | ORAL | Status: DC | PRN

## 2012-09-13 NOTE — Telephone Encounter (Signed)
Script is ready for pick up and I left a voice message for pt. 

## 2012-09-13 NOTE — Telephone Encounter (Signed)
done

## 2012-09-27 ENCOUNTER — Other Ambulatory Visit: Payer: Self-pay | Admitting: Family Medicine

## 2012-09-27 NOTE — Telephone Encounter (Signed)
Pt would like refill of HYDROmorphone (DILAUDID) 8 MG tablet. Pt daughter realizes its a little early, but I told her it was ok to call and pick up after first of the year. (Last filled 09/04/12)

## 2012-10-03 NOTE — Telephone Encounter (Signed)
Pt daughter will pick up on 10-07-2012

## 2012-10-07 MED ORDER — HYDROMORPHONE HCL 8 MG PO TABS: 8.0000 mg | ORAL_TABLET | Freq: Three times a day (TID) | ORAL | Status: DC | PRN

## 2012-10-07 NOTE — Telephone Encounter (Signed)
done

## 2012-10-07 NOTE — Telephone Encounter (Signed)
Script is ready and pt here to pick up.

## 2012-10-10 ENCOUNTER — Other Ambulatory Visit: Payer: Self-pay | Admitting: Family Medicine

## 2012-10-10 MED ORDER — OXYCODONE HCL 20 MG PO TABS: 20.0000 mg | ORAL_TABLET | Freq: Four times a day (QID) | ORAL | Status: DC | PRN

## 2012-10-10 NOTE — Telephone Encounter (Signed)
done

## 2012-10-10 NOTE — Telephone Encounter (Signed)
Pt needs new rx oxycodone 20 mg due on Monday 10-14-2012.

## 2012-10-10 NOTE — Telephone Encounter (Signed)
Script is ready for pick up.

## 2012-10-21 ENCOUNTER — Ambulatory Visit: Payer: 59 | Admitting: Family Medicine

## 2012-10-30 ENCOUNTER — Other Ambulatory Visit: Payer: Self-pay | Admitting: Family Medicine

## 2012-10-30 NOTE — Telephone Encounter (Signed)
No too early, need to do this next week

## 2012-10-30 NOTE — Telephone Encounter (Signed)
I spoke with pt  

## 2012-10-30 NOTE — Telephone Encounter (Signed)
Pt needs new rx hydromorphone due on 11-06-12

## 2012-11-06 ENCOUNTER — Other Ambulatory Visit: Payer: Self-pay | Admitting: Family Medicine

## 2012-11-06 NOTE — Telephone Encounter (Signed)
Pt needs new rx hydromorphone °

## 2012-11-07 MED ORDER — HYDROMORPHONE HCL 8 MG PO TABS: 8.0000 mg | ORAL_TABLET | Freq: Three times a day (TID) | ORAL | Status: DC | PRN

## 2012-11-07 NOTE — Telephone Encounter (Signed)
done

## 2012-11-07 NOTE — Telephone Encounter (Signed)
Script is ready for pick up and I spoke with pt.  

## 2012-11-12 ENCOUNTER — Telehealth: Payer: Self-pay | Admitting: Family Medicine

## 2012-11-12 NOTE — Telephone Encounter (Signed)
Pt needs new rx oxycodone °

## 2012-11-13 MED ORDER — OXYCODONE HCL 20 MG PO TABS: 20.0000 mg | ORAL_TABLET | Freq: Four times a day (QID) | ORAL | Status: DC | PRN

## 2012-11-13 NOTE — Telephone Encounter (Signed)
I left voice message

## 2012-11-13 NOTE — Telephone Encounter (Signed)
done

## 2012-11-26 ENCOUNTER — Ambulatory Visit (INDEPENDENT_AMBULATORY_CARE_PROVIDER_SITE_OTHER): Payer: Medicare Other | Admitting: Family Medicine

## 2012-11-26 ENCOUNTER — Encounter: Payer: Self-pay | Admitting: Family Medicine

## 2012-11-26 VITALS — BP 120/72 | HR 88 | Temp 98.0°F

## 2012-11-26 DIAGNOSIS — K921 Melena: Secondary | ICD-10-CM

## 2012-11-26 DIAGNOSIS — R197 Diarrhea, unspecified: Secondary | ICD-10-CM

## 2012-11-26 NOTE — Progress Notes (Signed)
  Subjective:    Patient ID: Karen Hardy, female    DOB: 1941-04-24, 72 y.o.   MRN: [id number]  HPI Here with her son-in-law for 2 weeks of lower abdominal pains, diarrhea with black stools, and occasional nausea with vomiting. No sweats or fever but she does feel chilled at times. No urinary symptoms. Drinking fluids but appetite is poor. She has had no food so far today and she has had only water to drink. She is S/P a partial colectomy in 2008.    Review of Systems  Constitutional: Positive for chills and appetite change. Negative for fever and diaphoresis.  Respiratory: Negative.   Cardiovascular: Negative.   Gastrointestinal: Positive for nausea, vomiting, abdominal pain, diarrhea and blood in stool. Negative for constipation, abdominal distention and rectal pain.  Endocrine: Negative.        Objective:   Physical Exam  Constitutional:  Alert, in her wheelchair   Neck: No thyromegaly present.  Cardiovascular: Normal rate, regular rhythm, normal heart sounds and intact distal pulses.   Pulmonary/Chest: Effort normal and breath sounds normal.  Abdominal: Soft. Bowel sounds are normal. She exhibits no distension and no mass. There is no rebound and no guarding.  Tender in both lower quadrants   Lymphadenopathy:    She has no cervical adenopathy.          Assessment & Plan:  She has had 2 weeks of diarrhea with what sounds like bloody stools. Possible etiologies include a diverticular bleed or diverticulitis. Her son-in-law will drive her directly to [facility] ER now for further evaluation.

## 2012-12-03 ENCOUNTER — Telehealth: Payer: Self-pay | Admitting: Family Medicine

## 2012-12-03 NOTE — Telephone Encounter (Signed)
Pt needs refill of HYDROmorphone (DILAUDID) 8 MG tablet

## 2012-12-04 MED ORDER — HYDROMORPHONE HCL 8 MG PO TABS: 8.0000 mg | ORAL_TABLET | Freq: Three times a day (TID) | ORAL | Status: DC | PRN

## 2012-12-04 NOTE — Telephone Encounter (Signed)
done

## 2012-12-04 NOTE — Telephone Encounter (Signed)
Script is ready for pick up, tried to reach pt & no answer. 

## 2012-12-09 ENCOUNTER — Telehealth: Payer: Self-pay | Admitting: Family Medicine

## 2012-12-09 MED ORDER — OXYCODONE HCL 20 MG PO TABS: 20.0000 mg | ORAL_TABLET | Freq: Four times a day (QID) | ORAL | Status: DC | PRN

## 2012-12-09 NOTE — Telephone Encounter (Signed)
Pt needs refill of Oxycodone HCl 20 MG TABS. If at all possible, could you do it today because pt's daughter is coming to pick up her script later. If not that's ok too.

## 2012-12-09 NOTE — Telephone Encounter (Signed)
done

## 2012-12-10 ENCOUNTER — Telehealth: Payer: Self-pay | Admitting: Family Medicine

## 2012-12-10 MED ORDER — FLUOXETINE HCL 20 MG PO CAPS: 20.0000 mg | ORAL_CAPSULE | Freq: Two times a day (BID) | ORAL | Status: DC

## 2012-12-10 NOTE — Telephone Encounter (Signed)
I sent script e-scribe. 

## 2012-12-10 NOTE — Telephone Encounter (Signed)
Refill request for Fluoxetine 20 mg take 1 po bid

## 2012-12-10 NOTE — Telephone Encounter (Signed)
Refill for one year 

## 2013-01-06 ENCOUNTER — Telehealth: Payer: Self-pay | Admitting: Family Medicine

## 2013-01-06 MED ORDER — HYDROMORPHONE HCL 8 MG PO TABS: 8.0000 mg | ORAL_TABLET | Freq: Three times a day (TID) | ORAL | Status: DC | PRN

## 2013-01-06 NOTE — Telephone Encounter (Signed)
done

## 2013-01-06 NOTE — Telephone Encounter (Signed)
PT daughter called to request refill of her mothers HYDROmorphone (DILAUDID) 8 MG tablet. Please assist.

## 2013-01-07 NOTE — Telephone Encounter (Signed)
Script is ready for pick up and left voice message for pt. 

## 2013-01-13 ENCOUNTER — Other Ambulatory Visit: Payer: Self-pay | Admitting: Family Medicine

## 2013-01-14 ENCOUNTER — Telehealth: Payer: Self-pay | Admitting: Family Medicine

## 2013-01-14 NOTE — Telephone Encounter (Signed)
Pt needs new rx oxycodone 20 mg. Pt daugh is aware MD out of office this wk

## 2013-01-16 MED ORDER — OXYCODONE HCL 20 MG PO TABS: 20.0000 mg | ORAL_TABLET | Freq: Four times a day (QID) | ORAL | Status: DC | PRN

## 2013-01-16 MED ORDER — CLONAZEPAM 0.5 MG PO TABS: 0.5000 mg | ORAL_TABLET | Freq: Four times a day (QID) | ORAL | Status: DC | PRN

## 2013-01-16 NOTE — Telephone Encounter (Signed)
Pt also needs clonazePAM (KLONOPIN) 0.5 MG tablet before Monday. Pls advise.

## 2013-01-16 NOTE — Telephone Encounter (Signed)
Pls advise.  

## 2013-01-16 NOTE — Telephone Encounter (Signed)
Pt last seen 2.25.14 and rx last filled 12/09/12. Pls advise.

## 2013-01-16 NOTE — Telephone Encounter (Signed)
Called pt to make aware that rx for oxycodne is ready for pick up. Called clonzepam in to pharmacy.

## 2013-02-03 ENCOUNTER — Telehealth: Payer: Self-pay | Admitting: Family Medicine

## 2013-02-03 NOTE — Telephone Encounter (Signed)
Rx last filled 01/06/13 #90. Pt last seen 11/26/12. Pls advise.

## 2013-02-03 NOTE — Telephone Encounter (Signed)
Pt's daughter called for pt's refill of HYDROmorphone (DILAUDID) 8 MG tablet. 1 po TID

## 2013-02-04 MED ORDER — HYDROMORPHONE HCL 8 MG PO TABS: 8.0000 mg | ORAL_TABLET | Freq: Three times a day (TID) | ORAL | Status: DC | PRN

## 2013-02-04 NOTE — Telephone Encounter (Signed)
done

## 2013-02-04 NOTE — Telephone Encounter (Signed)
Script is ready for pick up and left voice message for pt. 

## 2013-02-10 ENCOUNTER — Telehealth: Payer: Self-pay | Admitting: Family Medicine

## 2013-02-10 MED ORDER — OXYCODONE HCL 20 MG PO TABS: 20.0000 mg | ORAL_TABLET | Freq: Four times a day (QID) | ORAL | Status: DC | PRN

## 2013-02-10 NOTE — Telephone Encounter (Signed)
PT daughter called to request a refill of her mothers Oxycodone HCl 20 MG TABS. Please assist.

## 2013-02-10 NOTE — Telephone Encounter (Signed)
done

## 2013-02-11 NOTE — Telephone Encounter (Signed)
Script is ready for pick up and I left a voice message.  

## 2013-02-17 ENCOUNTER — Other Ambulatory Visit: Payer: Self-pay | Admitting: Family

## 2013-02-17 NOTE — Telephone Encounter (Signed)
Call in #120 with 5 rf 

## 2013-02-18 NOTE — Telephone Encounter (Signed)
I called in script 

## 2013-02-25 ENCOUNTER — Other Ambulatory Visit: Payer: Self-pay | Admitting: Family Medicine

## 2013-02-26 NOTE — Telephone Encounter (Signed)
Can we refill this? 

## 2013-02-26 NOTE — Telephone Encounter (Signed)
Pt is daughter is going out ot town tomorrow

## 2013-03-03 ENCOUNTER — Telehealth: Payer: Self-pay | Admitting: Family Medicine

## 2013-03-03 MED ORDER — HYDROMORPHONE HCL 8 MG PO TABS: 8.0000 mg | ORAL_TABLET | Freq: Three times a day (TID) | ORAL | Status: DC | PRN

## 2013-03-03 NOTE — Telephone Encounter (Signed)
done

## 2013-03-03 NOTE — Telephone Encounter (Signed)
Script is ready for pick up and I spoke with pt's daughter.

## 2013-03-03 NOTE — Telephone Encounter (Signed)
PT daughter called to request a refill of her mothers HYDROmorphone (DILAUDID) 8 MG tablet. Please assist.

## 2013-03-12 ENCOUNTER — Telehealth: Payer: Self-pay | Admitting: Family Medicine

## 2013-03-12 MED ORDER — OXYCODONE HCL 20 MG PO TABS: 20.0000 mg | ORAL_TABLET | Freq: Four times a day (QID) | ORAL | Status: DC | PRN

## 2013-03-12 NOTE — Telephone Encounter (Signed)
done

## 2013-03-12 NOTE — Telephone Encounter (Signed)
Pt need new rx oxycodone. Pt daughter will pick up Friday.

## 2013-03-13 NOTE — Telephone Encounter (Signed)
Script is ready for pick up and I left voice message for pt. 

## 2013-04-02 ENCOUNTER — Telehealth: Payer: Self-pay | Admitting: Family Medicine

## 2013-04-02 MED ORDER — HYDROMORPHONE HCL 8 MG PO TABS: 8.0000 mg | ORAL_TABLET | Freq: Three times a day (TID) | ORAL | Status: DC | PRN

## 2013-04-02 NOTE — Telephone Encounter (Signed)
Script is ready for pick up and I left voice message for pt. 

## 2013-04-02 NOTE — Telephone Encounter (Signed)
Pt needs of HYDROmorphone (DILAUDID) 8 MG tablet. 1 po tid prn

## 2013-04-02 NOTE — Telephone Encounter (Signed)
done

## 2013-04-09 ENCOUNTER — Telehealth: Payer: Self-pay | Admitting: Family Medicine

## 2013-04-09 NOTE — Telephone Encounter (Signed)
Pt needs refill of Oxycodone HCl 20 MG TABS 1/ 6 hrs prn Daughter would like to pick up Friday

## 2013-04-09 NOTE — Telephone Encounter (Signed)
Pt was last here in office on 11/26/12.

## 2013-04-09 NOTE — Telephone Encounter (Signed)
It appears the patient is on hydromorphone and oxycodone  PCP is out of the office  Okay to Rx #60 of the oxycodone until Dr. Clent Ridges is back for further refills.

## 2013-04-10 MED ORDER — OXYCODONE HCL 20 MG PO TABS: 20.0000 mg | ORAL_TABLET | Freq: Four times a day (QID) | ORAL | Status: DC | PRN

## 2013-04-10 NOTE — Telephone Encounter (Signed)
Script is ready for pick up and I left a voice message for pt. 

## 2013-04-25 ENCOUNTER — Telehealth: Payer: Self-pay | Admitting: Family Medicine

## 2013-04-25 MED ORDER — OXYCODONE HCL 20 MG PO TABS: 20.0000 mg | ORAL_TABLET | Freq: Four times a day (QID) | ORAL | Status: DC | PRN

## 2013-04-25 NOTE — Telephone Encounter (Signed)
done

## 2013-04-25 NOTE — Telephone Encounter (Signed)
Script is ready for pick up and I spoke with daughter.

## 2013-04-25 NOTE — Telephone Encounter (Signed)
Pt only received oxycodone 20 mg #60 on 04-10-13. Pt needs another #60. Pt daughter will pick up on Monday.

## 2013-04-30 ENCOUNTER — Telehealth: Payer: Self-pay | Admitting: Family Medicine

## 2013-04-30 MED ORDER — HYDROMORPHONE HCL 8 MG PO TABS: 8.0000 mg | ORAL_TABLET | Freq: Three times a day (TID) | ORAL | Status: DC | PRN

## 2013-04-30 NOTE — Telephone Encounter (Signed)
PT daughter called to request a refill of the pt's HYDROmorphone (DILAUDID) 8 MG tablet. Please assist.

## 2013-04-30 NOTE — Telephone Encounter (Signed)
done

## 2013-05-01 NOTE — Telephone Encounter (Signed)
Script is ready for pick up, tried to reach pt and no answer.  

## 2013-05-26 ENCOUNTER — Telehealth: Payer: Self-pay | Admitting: Family Medicine

## 2013-05-26 NOTE — Telephone Encounter (Signed)
Pt needs new rx oxycodone 20mg 

## 2013-05-27 MED ORDER — OXYCODONE HCL 20 MG PO TABS: 20.0000 mg | ORAL_TABLET | Freq: Four times a day (QID) | ORAL | Status: DC | PRN

## 2013-05-27 NOTE — Telephone Encounter (Signed)
Script is ready for pick up, tried to reach pt and no answer.  

## 2013-05-27 NOTE — Telephone Encounter (Signed)
done

## 2013-05-30 ENCOUNTER — Telehealth: Payer: Self-pay | Admitting: Family Medicine

## 2013-05-30 MED ORDER — HYDROMORPHONE HCL 8 MG PO TABS: 8.0000 mg | ORAL_TABLET | Freq: Three times a day (TID) | ORAL | Status: DC | PRN

## 2013-05-30 NOTE — Telephone Encounter (Signed)
done

## 2013-05-30 NOTE — Telephone Encounter (Signed)
Script is ready for pick up, tried to reach pt and no answer.  

## 2013-05-30 NOTE — Telephone Encounter (Signed)
PT daugther called to request a refill of her mothers HYDROmorphone (DILAUDID) 8 MG tablet. Please assist.

## 2013-06-23 ENCOUNTER — Telehealth: Payer: Self-pay | Admitting: Family Medicine

## 2013-06-23 NOTE — Telephone Encounter (Signed)
Pt daughter called to request a refill of her Oxycodone HCl 20 MG TABS. Please assist.

## 2013-06-24 MED ORDER — OXYCODONE HCL 20 MG PO TABS: 20.0000 mg | ORAL_TABLET | Freq: Four times a day (QID) | ORAL | Status: DC | PRN

## 2013-06-24 NOTE — Telephone Encounter (Signed)
Script is ready for pick up, tried to reach pt and no answer.  

## 2013-06-24 NOTE — Telephone Encounter (Signed)
done

## 2013-06-30 ENCOUNTER — Ambulatory Visit: Payer: Medicare Other | Admitting: Family Medicine

## 2013-07-03 ENCOUNTER — Telehealth: Payer: Self-pay | Admitting: Family Medicine

## 2013-07-03 MED ORDER — HYDROMORPHONE HCL 8 MG PO TABS: 8.0000 mg | ORAL_TABLET | Freq: Three times a day (TID) | ORAL | Status: DC | PRN

## 2013-07-03 NOTE — Telephone Encounter (Signed)
Pt request refill of HYDROmorphone (DILAUDID) 8 MG tablet

## 2013-07-03 NOTE — Telephone Encounter (Signed)
done

## 2013-07-04 NOTE — Telephone Encounter (Signed)
Robin aware rx ready for pick up.

## 2013-07-22 ENCOUNTER — Telehealth: Payer: Self-pay | Admitting: *Deleted

## 2013-07-22 MED ORDER — OXYCODONE HCL 20 MG PO TABS: 20.0000 mg | ORAL_TABLET | Freq: Four times a day (QID) | ORAL | Status: DC | PRN

## 2013-07-22 NOTE — Telephone Encounter (Signed)
done

## 2013-07-22 NOTE — Telephone Encounter (Signed)
Pt needs refill of  Oxycodone HCl 20 MG TABS

## 2013-07-23 NOTE — Telephone Encounter (Signed)
Script is ready for pick up and I left a voice message.  

## 2013-07-31 ENCOUNTER — Telehealth: Payer: Self-pay | Admitting: Family Medicine

## 2013-07-31 MED ORDER — HYDROMORPHONE HCL 8 MG PO TABS: 8.0000 mg | ORAL_TABLET | Freq: Three times a day (TID) | ORAL | Status: DC | PRN

## 2013-07-31 NOTE — Telephone Encounter (Signed)
Pt needs new rxs Hydromorphone and oxycodone.

## 2013-07-31 NOTE — Telephone Encounter (Signed)
I refilled the Hydromorphone but not the Oxycodone (we did this a week ago)

## 2013-08-01 NOTE — Telephone Encounter (Signed)
Script is ready for pick up and I left a voice message.  

## 2013-08-14 ENCOUNTER — Telehealth: Payer: Self-pay | Admitting: Family Medicine

## 2013-08-14 MED ORDER — TIZANIDINE HCL 4 MG PO TABS: ORAL_TABLET | ORAL | Status: DC

## 2013-08-14 NOTE — Telephone Encounter (Signed)
Given to her daughter

## 2013-08-18 ENCOUNTER — Other Ambulatory Visit: Payer: Self-pay | Admitting: Family Medicine

## 2013-08-19 ENCOUNTER — Other Ambulatory Visit: Payer: Self-pay | Admitting: Family Medicine

## 2013-08-20 ENCOUNTER — Telehealth: Payer: Self-pay | Admitting: Family Medicine

## 2013-08-20 MED ORDER — OXYCODONE HCL 20 MG PO TABS: 20.0000 mg | ORAL_TABLET | Freq: Four times a day (QID) | ORAL | Status: DC | PRN

## 2013-08-20 NOTE — Telephone Encounter (Signed)
Pt needs new rx oxycodone °

## 2013-08-20 NOTE — Telephone Encounter (Signed)
Done but she needs drug testing  

## 2013-08-20 NOTE — Telephone Encounter (Signed)
Call in #120 with 5 rf 

## 2013-08-20 NOTE — Telephone Encounter (Signed)
Script is ready for pick up, contract was printed and left a voice message for pt with this information.

## 2013-09-02 ENCOUNTER — Telehealth: Payer: Self-pay | Admitting: Family Medicine

## 2013-09-02 MED ORDER — HYDROMORPHONE HCL 8 MG PO TABS: 8.0000 mg | ORAL_TABLET | Freq: Three times a day (TID) | ORAL | Status: DC | PRN

## 2013-09-02 NOTE — Telephone Encounter (Signed)
Pt needs new rx hydromorphone °

## 2013-09-02 NOTE — Telephone Encounter (Signed)
done

## 2013-09-02 NOTE — Telephone Encounter (Signed)
Script is ready for pick up and I left a voice message.  

## 2013-09-18 ENCOUNTER — Telehealth: Payer: Self-pay | Admitting: Family Medicine

## 2013-09-18 NOTE — Telephone Encounter (Signed)
Pt request refill  °Oxycodone HCl 20 MG TABS °

## 2013-09-19 MED ORDER — OXYCODONE HCL 20 MG PO TABS: 20.0000 mg | ORAL_TABLET | Freq: Four times a day (QID) | ORAL | Status: DC | PRN

## 2013-09-19 NOTE — Telephone Encounter (Signed)
Script is ready for pick up and I left a voice message.  

## 2013-09-19 NOTE — Telephone Encounter (Signed)
done

## 2013-10-01 ENCOUNTER — Telehealth: Payer: Self-pay | Admitting: Family Medicine

## 2013-10-01 NOTE — Telephone Encounter (Signed)
Pt is requesting a refill of HYDROmorphone (DILAUDID) 8 MG tablet. Please advise.

## 2013-10-03 MED ORDER — HYDROMORPHONE HCL 8 MG PO TABS: 8.0000 mg | ORAL_TABLET | Freq: Three times a day (TID) | ORAL | Status: DC | PRN

## 2013-10-03 NOTE — Telephone Encounter (Signed)
Done but she needs testing and a contract ASAP

## 2013-10-03 NOTE — Telephone Encounter (Signed)
Contract was signed on 11.21.14. Her next screening is scheduled for 2.23.15. Pt's daughter aware Rx ready for pick up

## 2013-10-03 NOTE — Telephone Encounter (Signed)
Left messages for pt to call back.

## 2013-10-14 ENCOUNTER — Telehealth: Payer: Self-pay | Admitting: Family Medicine

## 2013-10-14 NOTE — Telephone Encounter (Signed)
Pt requesting refill of Oxycodone HCl 20 MG TABS °

## 2013-10-15 MED ORDER — OXYCODONE HCL 20 MG PO TABS: 20.0000 mg | ORAL_TABLET | Freq: Four times a day (QID) | ORAL | Status: DC | PRN

## 2013-10-15 NOTE — Telephone Encounter (Signed)
done

## 2013-10-16 NOTE — Telephone Encounter (Signed)
Daughter, Zella BallRobin is aware

## 2013-10-29 ENCOUNTER — Telehealth: Payer: Self-pay | Admitting: Family Medicine

## 2013-10-29 NOTE — Telephone Encounter (Signed)
Per pt's daughter Zella BallRobin, pt needs re-fill of HYDROmorphone (DILAUDID) 8 MG tablet

## 2013-10-30 MED ORDER — HYDROMORPHONE HCL 8 MG PO TABS: 8.0000 mg | ORAL_TABLET | Freq: Three times a day (TID) | ORAL | Status: DC | PRN

## 2013-10-30 NOTE — Telephone Encounter (Signed)
Script is ready for pick up and I left a voice message.  

## 2013-10-30 NOTE — Telephone Encounter (Signed)
done

## 2013-11-11 ENCOUNTER — Telehealth: Payer: Self-pay | Admitting: Family Medicine

## 2013-11-11 NOTE — Telephone Encounter (Signed)
Pt's daughter calling on behalf of pt to request refill ofOxycodone HCl 20 MG TABS, pt will be due for refill on Saturday

## 2013-11-13 MED ORDER — OXYCODONE HCL 20 MG PO TABS: 20.0000 mg | ORAL_TABLET | Freq: Four times a day (QID) | ORAL | Status: DC | PRN

## 2013-11-13 NOTE — Telephone Encounter (Signed)
Script is ready for pick up and I spoke with Robin. 

## 2013-11-13 NOTE — Telephone Encounter (Signed)
done

## 2013-11-28 ENCOUNTER — Telehealth: Payer: Self-pay | Admitting: Family Medicine

## 2013-11-28 MED ORDER — HYDROMORPHONE HCL 8 MG PO TABS: 8.0000 mg | ORAL_TABLET | Freq: Three times a day (TID) | ORAL | Status: DC | PRN

## 2013-11-28 NOTE — Telephone Encounter (Signed)
Pt needs re-fill oHYDROmorphone (DILAUDID) 8 MG tablet

## 2013-11-28 NOTE — Telephone Encounter (Signed)
done

## 2013-12-01 NOTE — Telephone Encounter (Signed)
Script is ready for pick up and I left a voice message.  

## 2013-12-10 ENCOUNTER — Telehealth: Payer: Self-pay | Admitting: Family Medicine

## 2013-12-10 MED ORDER — OXYCODONE HCL 20 MG PO TABS: 20.0000 mg | ORAL_TABLET | Freq: Four times a day (QID) | ORAL | Status: DC | PRN

## 2013-12-10 NOTE — Telephone Encounter (Signed)
Done but she needs an OV before the next refill

## 2013-12-10 NOTE — Telephone Encounter (Signed)
Pt request Oxycodone HCl 20 MG TABS  #120

## 2013-12-11 ENCOUNTER — Other Ambulatory Visit: Payer: Self-pay | Admitting: Family Medicine

## 2013-12-11 NOTE — Telephone Encounter (Signed)
Script is ready for pick up and spoke with pt. Also made a note on script for pt to schedule a office visit before next refill is due.

## 2013-12-14 ENCOUNTER — Other Ambulatory Visit: Payer: Self-pay | Admitting: Family Medicine

## 2014-01-01 ENCOUNTER — Telehealth: Payer: Self-pay | Admitting: Family Medicine

## 2014-01-01 MED ORDER — HYDROMORPHONE HCL 8 MG PO TABS: 8.0000 mg | ORAL_TABLET | Freq: Three times a day (TID) | ORAL | Status: DC | PRN

## 2014-01-01 MED ORDER — OXYCODONE HCL 20 MG PO TABS: 20.0000 mg | ORAL_TABLET | Freq: Four times a day (QID) | ORAL | Status: DC | PRN

## 2014-01-01 NOTE — Telephone Encounter (Signed)
Pt's daughter calling needing new rx for pt, pt has an appointment on 01/12/14, however she has meds that will run out prior to the appt. HYDROmorphone (DILAUDID) 8 MG tablet and oxycodone hci 20 mg.

## 2014-01-01 NOTE — Telephone Encounter (Signed)
Done

## 2014-01-01 NOTE — Telephone Encounter (Signed)
Script is ready for pick up and I left a message. 

## 2014-01-10 ENCOUNTER — Other Ambulatory Visit: Payer: Self-pay | Admitting: Family Medicine

## 2014-01-12 ENCOUNTER — Ambulatory Visit (INDEPENDENT_AMBULATORY_CARE_PROVIDER_SITE_OTHER): Payer: Medicare Other | Admitting: Family Medicine

## 2014-01-12 ENCOUNTER — Encounter: Payer: Self-pay | Admitting: Family Medicine

## 2014-01-12 VITALS — BP 110/70 | HR 104 | Temp 98.9°F

## 2014-01-12 DIAGNOSIS — F3289 Other specified depressive episodes: Secondary | ICD-10-CM

## 2014-01-12 DIAGNOSIS — G589 Mononeuropathy, unspecified: Secondary | ICD-10-CM

## 2014-01-12 DIAGNOSIS — M545 Low back pain, unspecified: Secondary | ICD-10-CM

## 2014-01-12 DIAGNOSIS — F329 Major depressive disorder, single episode, unspecified: Secondary | ICD-10-CM

## 2014-01-12 DIAGNOSIS — I10 Essential (primary) hypertension: Secondary | ICD-10-CM

## 2014-01-12 DIAGNOSIS — Z9109 Other allergy status, other than to drugs and biological substances: Secondary | ICD-10-CM

## 2014-01-12 MED ORDER — TEMAZEPAM 30 MG PO CAPS: 30.0000 mg | ORAL_CAPSULE | Freq: Every evening | ORAL | Status: DC | PRN

## 2014-01-12 NOTE — Progress Notes (Signed)
   Subjective:    Patient ID: Karen RinneJeannie Hardy, female    DOB: October 05, 1940, 73 y.o.   MRN: [id number]  HPI Here to follow up on multiple issues, with her daughter. Her chronic pain is well managed on her current regimen. She does complain of trouble sleeping. She has never taken a sleep med however. She has had a lot of sneezing, runny nose, and skin itching lately. No fever or ST or cough. She has had a rash on the back of the neck.    Review of Systems  Constitutional: Negative.   HENT: Positive for postnasal drip, rhinorrhea and sneezing.   Eyes: Positive for itching. Negative for discharge and redness.  Respiratory: Negative.   Cardiovascular: Negative.   Gastrointestinal: Negative.   Musculoskeletal: Positive for back pain.  Skin: Positive for rash.  Neurological: Negative.   Psychiatric/Behavioral: Positive for sleep disturbance. Negative for hallucinations, dysphoric mood and decreased concentration. The patient is nervous/anxious.        Objective:   Physical Exam  Constitutional: She is oriented to person, place, and time. She appears well-developed and well-nourished.  HENT:  Right Ear: External ear normal.  Left Ear: External ear normal.  Nose: Nose normal.  Mouth/Throat: Oropharynx is clear and moist.  Eyes: Conjunctivae are normal.  Cardiovascular: Normal rate, regular rhythm, normal heart sounds and intact distal pulses.   Pulmonary/Chest: Effort normal and breath sounds normal.  Lymphadenopathy:    She has no cervical adenopathy.  Neurological: She is alert and oriented to person, place, and time.  Skin:  Red maculopapular rash on the neck and upper back   Psychiatric: She has a normal mood and affect. Her behavior is normal. Thought content normal.          Assessment & Plan:  Her pain is well controlled. Try Temazepam for sleep. She is having trouble with allergies to the pollen right now so she will try Zyrtec daily. Use Clobetasol cream on the rash.

## 2014-01-12 NOTE — Progress Notes (Signed)
Pre visit review using our clinic review tool, if applicable. No additional management support is needed unless otherwise documented below in the visit note. 

## 2014-01-13 ENCOUNTER — Telehealth: Payer: Self-pay | Admitting: Family Medicine

## 2014-01-13 DIAGNOSIS — Z9109 Other allergy status, other than to drugs and biological substances: Secondary | ICD-10-CM | POA: Insufficient documentation

## 2014-01-13 NOTE — Telephone Encounter (Signed)
Relevant patient education mailed to patient.  

## 2014-01-28 ENCOUNTER — Telehealth: Payer: Self-pay | Admitting: Family Medicine

## 2014-01-28 MED ORDER — HYDROMORPHONE HCL 8 MG PO TABS: 8.0000 mg | ORAL_TABLET | Freq: Three times a day (TID) | ORAL | Status: DC | PRN

## 2014-01-28 NOTE — Telephone Encounter (Signed)
Pt needs new rx hydromorphone °

## 2014-01-28 NOTE — Telephone Encounter (Signed)
Script is ready for pick up and I left a message. 

## 2014-01-28 NOTE — Telephone Encounter (Signed)
done

## 2014-02-09 ENCOUNTER — Encounter: Payer: Self-pay | Admitting: Family Medicine

## 2014-02-10 ENCOUNTER — Telehealth: Payer: Self-pay | Admitting: Family Medicine

## 2014-02-10 NOTE — Telephone Encounter (Signed)
Pt needs re-fill on Oxycodone HCl 20 MG TABS She has enough till 02/11/14 morning

## 2014-02-12 MED ORDER — OXYCODONE HCL 20 MG PO TABS: 20.0000 mg | ORAL_TABLET | Freq: Four times a day (QID) | ORAL | Status: DC | PRN

## 2014-02-12 NOTE — Telephone Encounter (Signed)
Script is ready for pick up and I left message.

## 2014-02-12 NOTE — Telephone Encounter (Signed)
Daughter called to fu on refill request. Pt is out of he meds.

## 2014-02-12 NOTE — Telephone Encounter (Signed)
done

## 2014-03-03 ENCOUNTER — Telehealth: Payer: Self-pay | Admitting: Family Medicine

## 2014-03-03 NOTE — Telephone Encounter (Signed)
Pt needs new rx hydromorphone °

## 2014-03-04 MED ORDER — HYDROMORPHONE HCL 8 MG PO TABS
8.0000 mg | ORAL_TABLET | Freq: Three times a day (TID) | ORAL | Status: DC | PRN
Start: 2014-03-04 — End: 2014-04-27

## 2014-03-04 NOTE — Telephone Encounter (Signed)
Script is ready for pick up and I left a voice message.  

## 2014-03-04 NOTE — Telephone Encounter (Signed)
done

## 2014-03-09 ENCOUNTER — Telehealth: Payer: Self-pay | Admitting: Family Medicine

## 2014-03-09 NOTE — Telephone Encounter (Signed)
Pt req rx on Oxycodone HCl 20 MG TABS

## 2014-03-10 MED ORDER — OXYCODONE HCL 20 MG PO TABS: 20.0000 mg | ORAL_TABLET | Freq: Four times a day (QID) | ORAL | Status: DC | PRN

## 2014-03-10 NOTE — Telephone Encounter (Signed)
done

## 2014-03-10 NOTE — Telephone Encounter (Signed)
Script is ready for pick up and left a voice message. 

## 2014-03-13 ENCOUNTER — Other Ambulatory Visit: Payer: Self-pay | Admitting: Family Medicine

## 2014-03-13 NOTE — Telephone Encounter (Signed)
Call in #120 with 5 rf 

## 2014-03-25 ENCOUNTER — Telehealth: Payer: Self-pay | Admitting: Family Medicine

## 2014-03-25 NOTE — Telephone Encounter (Signed)
Pt request tiZANidine (ZANAFLEX) 4 MG tablet [facility] [address]  Pt is out of meds

## 2014-03-26 MED ORDER — TIZANIDINE HCL 4 MG PO TABS: ORAL_TABLET | ORAL | Status: DC

## 2014-03-26 NOTE — Telephone Encounter (Signed)
I sent script e-scribe. 

## 2014-04-08 ENCOUNTER — Telehealth: Payer: Self-pay | Admitting: Family Medicine

## 2014-04-08 NOTE — Telephone Encounter (Signed)
Pt needs new rx oxycodone °

## 2014-04-09 MED ORDER — OXYCODONE HCL 20 MG PO TABS: 20.0000 mg | ORAL_TABLET | Freq: Four times a day (QID) | ORAL | Status: DC | PRN

## 2014-04-09 NOTE — Telephone Encounter (Signed)
Left a message rx ready for pick up.

## 2014-04-09 NOTE — Telephone Encounter (Signed)
done

## 2014-04-27 ENCOUNTER — Telehealth: Payer: Self-pay | Admitting: Family Medicine

## 2014-04-27 ENCOUNTER — Other Ambulatory Visit: Payer: Self-pay | Admitting: Family Medicine

## 2014-04-27 MED ORDER — HYDROMORPHONE HCL 8 MG PO TABS: 8.0000 mg | ORAL_TABLET | Freq: Three times a day (TID) | ORAL | Status: DC | PRN

## 2014-04-27 NOTE — Telephone Encounter (Signed)
done

## 2014-04-27 NOTE — Telephone Encounter (Signed)
Pt request refill Pt request refill HYDROmorphone (DILAUDID) 8 MG tablet

## 2014-04-28 NOTE — Telephone Encounter (Signed)
Script is ready for pick up and I left a voice message.  

## 2014-05-06 ENCOUNTER — Telehealth: Payer: Self-pay | Admitting: Family Medicine

## 2014-05-06 MED ORDER — OXYCODONE HCL 20 MG PO TABS: 20.0000 mg | ORAL_TABLET | Freq: Four times a day (QID) | ORAL | Status: DC | PRN

## 2014-05-06 NOTE — Telephone Encounter (Signed)
Pt request refill of the following:Oxycodone HCl 20 MG TABS    Phamacy:   Pick up

## 2014-05-06 NOTE — Telephone Encounter (Signed)
done

## 2014-05-07 NOTE — Telephone Encounter (Signed)
Script is ready for pick up and I left a voice message.  

## 2014-05-27 ENCOUNTER — Encounter: Payer: Self-pay | Admitting: Family Medicine

## 2014-05-27 ENCOUNTER — Ambulatory Visit (INDEPENDENT_AMBULATORY_CARE_PROVIDER_SITE_OTHER): Payer: Medicare Other | Admitting: Family Medicine

## 2014-05-27 VITALS — BP 150/80 | HR 106 | Temp 99.2°F

## 2014-05-27 DIAGNOSIS — I10 Essential (primary) hypertension: Secondary | ICD-10-CM

## 2014-05-27 DIAGNOSIS — H8192 Unspecified disorder of vestibular function, left ear: Secondary | ICD-10-CM

## 2014-05-27 DIAGNOSIS — G589 Mononeuropathy, unspecified: Secondary | ICD-10-CM

## 2014-05-27 DIAGNOSIS — Z8679 Personal history of other diseases of the circulatory system: Secondary | ICD-10-CM

## 2014-05-27 DIAGNOSIS — M545 Low back pain, unspecified: Secondary | ICD-10-CM

## 2014-05-27 DIAGNOSIS — H9390 Unspecified disorder of ear, unspecified ear: Secondary | ICD-10-CM

## 2014-05-27 DIAGNOSIS — M26629 Arthralgia of temporomandibular joint, unspecified side: Secondary | ICD-10-CM

## 2014-05-27 MED ORDER — PROMETHAZINE HCL 25 MG PO TABS: 25.0000 mg | ORAL_TABLET | ORAL | Status: AC | PRN

## 2014-05-27 MED ORDER — HYDROMORPHONE HCL 8 MG PO TABS: 8.0000 mg | ORAL_TABLET | Freq: Three times a day (TID) | ORAL | Status: DC | PRN

## 2014-05-27 MED ORDER — MECLIZINE HCL 25 MG PO TABS: 25.0000 mg | ORAL_TABLET | ORAL | Status: AC | PRN

## 2014-05-27 MED ORDER — METHYLPREDNISOLONE 4 MG PO KIT: PACK | ORAL | Status: AC

## 2014-05-27 NOTE — Progress Notes (Signed)
   Subjective:    Patient ID: Karen Hardy, female    DOB: [birth year], 73 y.o.   MRN: [id number]  HPI Here for 2 weeks of dizziness which feels like the room is spinning and left ear pain. She also feels some pain in the left side of the head and the left neck. No ST or cough or fever. This also makes her feel nauseated.    Review of Systems  Constitutional: Negative.   HENT: Positive for ear pain. Negative for congestion, hearing loss, postnasal drip and sinus pressure.   Eyes: Negative.   Respiratory: Negative.   Neurological: Positive for dizziness. Negative for headaches.       Objective:   Physical Exam  Constitutional: She is oriented to person, place, and time. She appears well-developed and well-nourished. No distress.  HENT:  Head: Normocephalic and atraumatic.  Right Ear: External ear normal.  Left Ear: External ear normal.  Nose: Nose normal.  Mouth/Throat: Oropharynx is clear and moist. No oropharyngeal exudate.  Both TMJ joints have a lot of crepitus and the left one is very tender.   Eyes: Conjunctivae are normal.  Neck: No thyromegaly present.  Lymphadenopathy:    She has no cervical adenopathy.  Neurological: She is alert and oriented to person, place, and time. No cranial nerve deficit.          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 Use hot compresses, Meclizine, and a steroid dose pack.

## 2014-05-27 NOTE — Progress Notes (Signed)
Pre visit review using our clinic review tool, if applicable. No additional management support is needed unless otherwise documented below in the visit note. 

## 2014-06-03 ENCOUNTER — Telehealth: Payer: Self-pay | Admitting: Family Medicine

## 2014-06-03 MED ORDER — OXYCODONE HCL 20 MG PO TABS: 20.0000 mg | ORAL_TABLET | Freq: Four times a day (QID) | ORAL | Status: DC | PRN

## 2014-06-03 NOTE — Telephone Encounter (Signed)
done

## 2014-06-03 NOTE — Telephone Encounter (Signed)
Pt needs new rx oxycodone °

## 2014-06-05 NOTE — Telephone Encounter (Signed)
Script is ready for pick up and I left a voice message.  

## 2014-06-26 ENCOUNTER — Telehealth: Payer: Self-pay | Admitting: Family Medicine

## 2014-06-26 ENCOUNTER — Other Ambulatory Visit: Payer: Self-pay | Admitting: Family Medicine

## 2014-06-26 NOTE — Telephone Encounter (Signed)
Pt needs new rx hydromorphone °

## 2014-06-29 MED ORDER — HYDROMORPHONE HCL 8 MG PO TABS: 8.0000 mg | ORAL_TABLET | Freq: Three times a day (TID) | ORAL | Status: DC | PRN

## 2014-06-29 NOTE — Telephone Encounter (Signed)
done

## 2014-06-29 NOTE — Telephone Encounter (Signed)
Script is ready for pick up and I left a voice message.  

## 2014-07-07 ENCOUNTER — Telehealth: Payer: Self-pay | Admitting: Family Medicine

## 2014-07-07 ENCOUNTER — Other Ambulatory Visit: Payer: Self-pay | Admitting: Family Medicine

## 2014-07-07 MED ORDER — OXYCODONE HCL 20 MG PO TABS: 20.0000 mg | ORAL_TABLET | Freq: Four times a day (QID) | ORAL | Status: DC | PRN

## 2014-07-07 NOTE — Telephone Encounter (Signed)
done

## 2014-07-07 NOTE — Telephone Encounter (Signed)
Pt needs new rx oxycodone °

## 2014-07-07 NOTE — Telephone Encounter (Signed)
Script is ready for pick up and I left a voice message for pt. 

## 2014-07-08 NOTE — Telephone Encounter (Signed)
Call in #[phone] with 5 rf 

## 2014-07-24 ENCOUNTER — Other Ambulatory Visit: Payer: Self-pay | Admitting: Family Medicine

## 2014-08-04 ENCOUNTER — Telehealth: Payer: Self-pay | Admitting: Family Medicine

## 2014-08-04 MED ORDER — OXYCODONE HCL 20 MG PO TABS: 20.0000 mg | ORAL_TABLET | Freq: Four times a day (QID) | ORAL | Status: DC | PRN

## 2014-08-04 MED ORDER — HYDROMORPHONE HCL 8 MG PO TABS: 8.0000 mg | ORAL_TABLET | Freq: Three times a day (TID) | ORAL | Status: DC | PRN

## 2014-08-04 NOTE — Telephone Encounter (Signed)
Script is ready for pick up and I spoke with Robin.

## 2014-08-04 NOTE — Telephone Encounter (Signed)
Pt request refill of the following: HYDROmorphone (DILAUDID) 8 MG tablet, Oxycodone HCl 20 MG TABS   Phamacy: [facility] [address]

## 2014-08-04 NOTE — Telephone Encounter (Signed)
done

## 2014-08-26 ENCOUNTER — Other Ambulatory Visit: Payer: Self-pay | Admitting: Family Medicine

## 2014-09-02 ENCOUNTER — Telehealth: Payer: Self-pay | Admitting: Family Medicine

## 2014-09-02 NOTE — Telephone Encounter (Signed)
Pt request refill of the following: Oxycodone HCl 20 MG TABS,HYDROmorphone (DILAUDID) 8 MG tablet   Phamacy: pick up

## 2014-09-04 MED ORDER — HYDROMORPHONE HCL 8 MG PO TABS: 8.0000 mg | ORAL_TABLET | Freq: Three times a day (TID) | ORAL | Status: DC | PRN

## 2014-09-04 MED ORDER — OXYCODONE HCL 20 MG PO TABS: 20.0000 mg | ORAL_TABLET | Freq: Four times a day (QID) | ORAL | Status: DC | PRN

## 2014-09-04 NOTE — Telephone Encounter (Signed)
done

## 2014-09-04 NOTE — Telephone Encounter (Signed)
Pt's daughter aware and will pu this afternoon

## 2014-09-08 ENCOUNTER — Other Ambulatory Visit: Payer: Self-pay | Admitting: Family Medicine

## 2014-09-09 NOTE — Telephone Encounter (Signed)
Called to the pharmacy and left on machine. 

## 2014-09-09 NOTE — Telephone Encounter (Signed)
Call in #120 with 5 rf 

## 2014-10-06 ENCOUNTER — Other Ambulatory Visit: Payer: Self-pay | Admitting: Family Medicine

## 2014-10-06 MED ORDER — HYDROMORPHONE HCL 8 MG PO TABS: 8.0000 mg | ORAL_TABLET | Freq: Three times a day (TID) | ORAL | Status: DC | PRN

## 2014-10-06 MED ORDER — OXYCODONE HCL 20 MG PO TABS: 20.0000 mg | ORAL_TABLET | Freq: Four times a day (QID) | ORAL | Status: DC | PRN

## 2014-10-06 NOTE — Telephone Encounter (Signed)
Pt needs her Oxycodone and her Hydromorphone refilled and her [facility]epam [facility] to [facility] [address]. Can leave a message for Zella BallRobin when ready for pick up.

## 2014-10-06 NOTE — Telephone Encounter (Signed)
We already gave her a 6 month supply of Clonazepam on 09-09-14. The other two were printed

## 2014-10-07 ENCOUNTER — Telehealth: Payer: Self-pay | Admitting: *Deleted

## 2014-10-07 NOTE — Telephone Encounter (Signed)
Called and spoke with Zella BallRobin and she is aware.

## 2014-10-07 NOTE — Telephone Encounter (Signed)
Patient is requesting a refill of fluoxetine 20 mg, 90 day supply. This hasn't been filled in awhile. Okay to refill? [facility] [address]

## 2014-10-08 MED ORDER — FLUOXETINE HCL 20 MG PO CAPS: 20.0000 mg | ORAL_CAPSULE | Freq: Two times a day (BID) | ORAL | Status: DC

## 2014-10-08 NOTE — Telephone Encounter (Signed)
done

## 2014-10-22 ENCOUNTER — Other Ambulatory Visit: Payer: Self-pay | Admitting: Family Medicine

## 2014-11-02 ENCOUNTER — Telehealth: Payer: Self-pay | Admitting: Family Medicine

## 2014-11-02 MED ORDER — HYDROMORPHONE HCL 8 MG PO TABS
8.0000 mg | ORAL_TABLET | Freq: Three times a day (TID) | ORAL | Status: DC | PRN
Start: 2014-11-02 — End: 2014-12-04

## 2014-11-02 MED ORDER — OXYCODONE HCL 20 MG PO TABS: 20.0000 mg | ORAL_TABLET | Freq: Four times a day (QID) | ORAL | Status: DC | PRN

## 2014-11-02 NOTE — Telephone Encounter (Signed)
done

## 2014-11-02 NOTE — Telephone Encounter (Signed)
Refill request for Oxycodone & Dilaudid, may be a few days early. Also can we put on separate sheets to fill. Pt's daughter Zella BallRobin is here with son and would like to pick up scripts.

## 2014-12-02 ENCOUNTER — Telehealth: Payer: Self-pay | Admitting: Family Medicine

## 2014-12-02 NOTE — Telephone Encounter (Signed)
Pt is not due for refills until Sunday. Pt needs new rxs oxycodone and hydromorphone

## 2014-12-04 MED ORDER — OXYCODONE HCL 20 MG PO TABS: 20.0000 mg | ORAL_TABLET | Freq: Four times a day (QID) | ORAL | Status: DC | PRN

## 2014-12-04 MED ORDER — HYDROMORPHONE HCL 8 MG PO TABS: 8.0000 mg | ORAL_TABLET | Freq: Three times a day (TID) | ORAL | Status: DC | PRN

## 2014-12-04 NOTE — Telephone Encounter (Signed)
done

## 2014-12-04 NOTE — Telephone Encounter (Signed)
Scripts are ready for pick up and I left a voice message for pt. 

## 2014-12-19 ENCOUNTER — Other Ambulatory Visit: Payer: Self-pay | Admitting: Family Medicine

## 2014-12-30 ENCOUNTER — Other Ambulatory Visit: Payer: Self-pay | Admitting: Family Medicine

## 2014-12-30 NOTE — Telephone Encounter (Signed)
Pt needs new rxs oxycodone and hydromorphone. Pt is not due until Sunday. Pt daughter is aware md out of office until Monday 01-04-15

## 2014-12-30 NOTE — Telephone Encounter (Signed)
PCP NA Patient on high dose opiates( 2 types)  and chronic benzos   Can rx 5 days of oxycodone and hydromorphone   Disp 20 oxy codone,  15 hydromorphone to avoid withdrawal. Until Dr Clent RidgesFry back in office to do  further refills

## 2014-12-31 NOTE — Telephone Encounter (Signed)
I spoke with Karen Hardy and she said that pt will wait until Dr. Clent RidgesFry returns to the office. Call when scripts are ready for pick up.

## 2015-01-05 MED ORDER — HYDROMORPHONE HCL 8 MG PO TABS: 8.0000 mg | ORAL_TABLET | Freq: Three times a day (TID) | ORAL | Status: DC | PRN

## 2015-01-05 MED ORDER — OXYCODONE HCL 20 MG PO TABS: 20.0000 mg | ORAL_TABLET | Freq: Four times a day (QID) | ORAL | Status: DC | PRN

## 2015-01-05 NOTE — Telephone Encounter (Signed)
done

## 2015-01-05 NOTE — Telephone Encounter (Signed)
Robin called to fu on rx. Just call when ready.

## 2015-01-06 NOTE — Telephone Encounter (Signed)
Called and spoke with Zella BallRobin and she is aware pt's rx is ready for pick up.

## 2015-02-02 ENCOUNTER — Telehealth: Payer: Self-pay | Admitting: Family Medicine

## 2015-02-02 MED ORDER — HYDROMORPHONE HCL 8 MG PO TABS: 8.0000 mg | ORAL_TABLET | Freq: Three times a day (TID) | ORAL | Status: DC | PRN

## 2015-02-02 MED ORDER — OXYCODONE HCL 20 MG PO TABS: 20.0000 mg | ORAL_TABLET | Freq: Four times a day (QID) | ORAL | Status: DC | PRN

## 2015-02-02 NOTE — Telephone Encounter (Signed)
done

## 2015-02-02 NOTE — Telephone Encounter (Signed)
° ° °  Pt request refill of the following: ° °Oxycodone HCl 20 MG TABS ° °HYDROmorphone (DILAUDID) 8 MG tablet ° ° °Phamacy: °

## 2015-02-03 NOTE — Telephone Encounter (Signed)
Scripts are ready for pick up and I spoke with daughter. 

## 2015-02-18 ENCOUNTER — Other Ambulatory Visit: Payer: Self-pay | Admitting: Family Medicine

## 2015-03-02 ENCOUNTER — Telehealth: Payer: Self-pay | Admitting: Family Medicine

## 2015-03-02 MED ORDER — OXYCODONE HCL 20 MG PO TABS: 20.0000 mg | ORAL_TABLET | Freq: Four times a day (QID) | ORAL | Status: DC | PRN

## 2015-03-02 MED ORDER — HYDROMORPHONE HCL 8 MG PO TABS: 8.0000 mg | ORAL_TABLET | Freq: Three times a day (TID) | ORAL | Status: DC | PRN

## 2015-03-02 NOTE — Telephone Encounter (Signed)
done

## 2015-03-02 NOTE — Telephone Encounter (Signed)
Pt needs new rxs hydromorphone and oxycodone

## 2015-03-02 NOTE — Telephone Encounter (Signed)
Script is ready for pick up and I left a voice message.  

## 2015-03-08 ENCOUNTER — Other Ambulatory Visit: Payer: Self-pay | Admitting: Family Medicine

## 2015-03-09 NOTE — Telephone Encounter (Signed)
Call in #120 with 5 rf 

## 2015-03-31 ENCOUNTER — Telehealth: Payer: Self-pay | Admitting: Family Medicine

## 2015-03-31 NOTE — Telephone Encounter (Signed)
Patient need refill of Oxycodone 20 mg, Hydromorphone 8 mg

## 2015-04-01 MED ORDER — OXYCODONE HCL 20 MG PO TABS: 20.0000 mg | ORAL_TABLET | Freq: Four times a day (QID) | ORAL | Status: DC | PRN

## 2015-04-01 MED ORDER — HYDROMORPHONE HCL 8 MG PO TABS: 8.0000 mg | ORAL_TABLET | Freq: Three times a day (TID) | ORAL | Status: DC | PRN

## 2015-04-01 NOTE — Telephone Encounter (Signed)
Scripts are ready for pick up and I spoke with daughter.

## 2015-04-01 NOTE — Telephone Encounter (Signed)
done

## 2015-04-18 ENCOUNTER — Other Ambulatory Visit: Payer: Self-pay | Admitting: Family Medicine

## 2015-04-19 ENCOUNTER — Other Ambulatory Visit: Payer: Self-pay | Admitting: Family Medicine

## 2015-04-30 ENCOUNTER — Telehealth: Payer: Self-pay | Admitting: Family Medicine

## 2015-04-30 NOTE — Telephone Encounter (Signed)
Pt needs new rxs oxycodone and hydromorphone. Pt is not due until tues

## 2015-05-03 MED ORDER — OXYCODONE HCL 20 MG PO TABS: 20.0000 mg | ORAL_TABLET | Freq: Four times a day (QID) | ORAL | Status: AC | PRN

## 2015-05-03 MED ORDER — HYDROMORPHONE HCL 8 MG PO TABS: 8.0000 mg | ORAL_TABLET | Freq: Three times a day (TID) | ORAL | Status: AC | PRN

## 2015-05-03 NOTE — Telephone Encounter (Signed)
Scripts are ready for pick up and I spoke with Robin.

## 2015-05-03 NOTE — Telephone Encounter (Signed)
done

## 2015-06-03 DEATH — deceased

## 2015-06-15 ENCOUNTER — Other Ambulatory Visit: Payer: Self-pay | Admitting: Family Medicine

## 2015-10-13 ENCOUNTER — Other Ambulatory Visit: Payer: Self-pay | Admitting: Family Medicine

## 2015-10-13 NOTE — Telephone Encounter (Signed)
Pt last visit 05/27/14  Pt last Rx refill 10/08/14 #180 with 3 refills
